# Patient Record
Sex: Female | Born: 1965 | Race: White | Hispanic: No | Marital: Married | Smoking: Never smoker
Health system: Southern US, Community
[De-identification: ages and names within clinical notes are randomized; demographics above are authoritative.]

---

## 2004-11-26 ENCOUNTER — Encounter: Payer: Self-pay | Admitting: Family Medicine

## 2004-11-26 LAB — CONVERTED CEMR LAB

## 2005-12-22 ENCOUNTER — Ambulatory Visit: Payer: Self-pay | Admitting: Family Medicine

## 2007-01-29 ENCOUNTER — Encounter: Payer: Self-pay | Admitting: Family Medicine

## 2007-08-02 ENCOUNTER — Encounter: Payer: Self-pay | Admitting: Family Medicine

## 2007-08-02 DIAGNOSIS — J301 Allergic rhinitis due to pollen: Secondary | ICD-10-CM

## 2007-08-02 DIAGNOSIS — E7800 Pure hypercholesterolemia, unspecified: Secondary | ICD-10-CM

## 2007-08-02 DIAGNOSIS — K219 Gastro-esophageal reflux disease without esophagitis: Secondary | ICD-10-CM

## 2007-08-02 DIAGNOSIS — J45909 Unspecified asthma, uncomplicated: Secondary | ICD-10-CM | POA: Insufficient documentation

## 2007-08-05 ENCOUNTER — Ambulatory Visit: Payer: Self-pay | Admitting: Family Medicine

## 2007-08-05 DIAGNOSIS — M899 Disorder of bone, unspecified: Secondary | ICD-10-CM | POA: Insufficient documentation

## 2007-08-05 DIAGNOSIS — M949 Disorder of cartilage, unspecified: Secondary | ICD-10-CM

## 2007-08-05 DIAGNOSIS — K2900 Acute gastritis without bleeding: Secondary | ICD-10-CM | POA: Insufficient documentation

## 2007-08-15 ENCOUNTER — Telehealth (INDEPENDENT_AMBULATORY_CARE_PROVIDER_SITE_OTHER): Payer: Self-pay | Admitting: *Deleted

## 2007-08-21 ENCOUNTER — Ambulatory Visit: Payer: Self-pay | Admitting: Family Medicine

## 2007-08-21 DIAGNOSIS — K644 Residual hemorrhoidal skin tags: Secondary | ICD-10-CM

## 2007-08-21 DIAGNOSIS — D179 Benign lipomatous neoplasm, unspecified: Secondary | ICD-10-CM | POA: Insufficient documentation

## 2007-08-21 DIAGNOSIS — M25519 Pain in unspecified shoulder: Secondary | ICD-10-CM

## 2007-11-28 ENCOUNTER — Ambulatory Visit (HOSPITAL_COMMUNITY): Admission: RE | Admit: 2007-11-28 | Discharge: 2007-11-28 | Payer: Self-pay | Admitting: Obstetrics and Gynecology

## 2007-11-28 ENCOUNTER — Encounter (INDEPENDENT_AMBULATORY_CARE_PROVIDER_SITE_OTHER): Payer: Self-pay | Admitting: Obstetrics and Gynecology

## 2008-04-07 ENCOUNTER — Ambulatory Visit: Payer: Self-pay | Admitting: Orthopedic Surgery

## 2008-12-01 ENCOUNTER — Ambulatory Visit: Payer: Self-pay | Admitting: Family Medicine

## 2010-09-18 ENCOUNTER — Encounter: Payer: Self-pay | Admitting: Obstetrics and Gynecology

## 2011-01-10 NOTE — Op Note (Signed)
[name], [name]       ACCOUNT NO.:  [id number]   MEDICAL RECORD NO.:  [id number]          PATIENT TYPE:  AMB   LOCATION:  SDC                           FACILITY:  [facility]   PHYSICIAN:  Melanie Kid. Rana Sheppard, M.D.    DATE OF BIRTH:  [birth year]   DATE OF PROCEDURE:  11/28/2007  DATE OF DISCHARGE:                               OPERATIVE REPORT   PREOPERATIVE DIAGNOSIS:  Abnormal uterine bleeding and endometrial mass.   POSTOPERATIVE DIAGNOSIS:  Abnormal uterine bleeding and endometrial  mass, endometrial polyp.   PROCEDURE:  Hysteroscopy dilation and curettage with polypectomy.   SURGEON:  Dr. Candice Camp.   ANESTHESIA:  Monitored anesthetic care and paracervical block.   INDICATIONS:  Melanie Sheppard is a 45 year old with abnormal uterine bleeding  whose bleeding has not been controlled with hormone therapy.  Underwent  a saline infusion ultrasound which shows a 1.8 cm density consistent  with a large endometrial polyp.  She presents for hysteroscopy, dilation  and curettage for evaluation and removal of this.  The risks and  benefits of the procedure were discussed at length.  Informed consent  was obtained.   FINDINGS AT TIME OF SURGERY:  A large endometrial polyp approximately 2  cm in size.  Otherwise normal-appearing endometrial cavity.   DESCRIPTION OF PROCEDURE:  After adequate analgesia, the patient placed  in the dorsal lithotomy position.  She is sterilely prepped and draped.  Bladder sterilely drained.  Graves speculum placed, tenaculum placed on  anterior lip of the cervix.  Paracervical block was placed with 1%  Xylocaine 1:100,000 epinephrine, total of 20 mL used.  The uterus was  dilated to a #27 Pratt dilator.  Uterus was sounded to 9 cm.  Hysteroscope was inserted.  The above findings were noted.  Polyp  forceps were used to grasp and remove the endometrial polyp in several  sections, followed by sharp curettage, retrieving small fragments of the  endometrial polyp and  also endometrial curettings.  Reexamination with  hysteroscope revealed no residual polyps and a normal-appearing  endometrial cavity, ostia and cervix.  Hysteroscope was removed.  Tenaculum removed from the cervix, noted to be hemostatic.  The patient  was then transferred to recovery room in stable condition.  Sponge and  instrument counts normal x3.  The patient received 1 gram of cefotetan  preoperatively, 30 mg Toradol postoperatively.  Sorbitol deficit was 100  mL and the blood loss was minimal.   DISPOSITION:  The patient will be discharged home with follow-up in the  office two to three weeks.  Sent home with a routine instruction sheet  for D&C. Told to return for increased pain, fever or bleeding.     Melanie Sheppard, M.D.  Electronically Signed    DCL/MEDQ  D:  11/28/2007  T:  11/28/2007  Job:  [id number]

## 2011-05-23 LAB — CBC
HCT: 38.8
RBC: 4.47

## 2011-07-02 ENCOUNTER — Emergency Department: Payer: Self-pay | Admitting: *Deleted

## 2011-07-16 ENCOUNTER — Emergency Department: Payer: Self-pay | Admitting: Emergency Medicine

## 2013-01-02 ENCOUNTER — Ambulatory Visit: Payer: Self-pay | Admitting: Internal Medicine

## 2017-05-04 ENCOUNTER — Ambulatory Visit: Payer: Self-pay | Admitting: Podiatry

## 2017-08-22 ENCOUNTER — Other Ambulatory Visit: Payer: Self-pay | Admitting: Student

## 2017-08-22 DIAGNOSIS — Z1239 Encounter for other screening for malignant neoplasm of breast: Secondary | ICD-10-CM

## 2020-03-19 ENCOUNTER — Ambulatory Visit (INDEPENDENT_AMBULATORY_CARE_PROVIDER_SITE_OTHER): Payer: 59 | Admitting: Family Medicine

## 2020-03-19 ENCOUNTER — Encounter: Payer: Self-pay | Admitting: Family Medicine

## 2020-03-19 ENCOUNTER — Other Ambulatory Visit: Payer: Self-pay

## 2020-03-19 ENCOUNTER — Ambulatory Visit (INDEPENDENT_AMBULATORY_CARE_PROVIDER_SITE_OTHER): Payer: 59

## 2020-03-19 VITALS — BP 116/84 | HR 71 | Ht 60.0 in | Wt 158.0 lb

## 2020-03-19 DIAGNOSIS — R0789 Other chest pain: Secondary | ICD-10-CM | POA: Insufficient documentation

## 2020-03-19 MED ORDER — VITAMIN D (ERGOCALCIFEROL) 1.25 MG (50000 UNIT) PO CAPS: 50000.0000 [IU] | ORAL_CAPSULE | ORAL | 0 refills | Status: AC

## 2020-03-19 MED ORDER — COLCHICINE 0.6 MG PO TABS: 0.6000 mg | ORAL_TABLET | Freq: Every day | ORAL | 0 refills | Status: DC

## 2020-03-19 NOTE — Assessment & Plan Note (Signed)
With normal x-rays likely secondary to more of a postural injury.  Patient is going to colchicine and vitamin D.  Patient is going to do some mild home exercise, trial of topical anti-inflammatories I would pretty significant that the radicular symptoms does not consistent with the injury.  We will monitor though.  Worsening pain will consider the possibility of advanced imaging but I highly think that would be unlikely.  Patient has some moderate discomfort syndrome pains especially of the upper back that could potentially respond to osteopathic manipulations which we can discuss at follow-up in 6 weeks

## 2020-03-19 NOTE — Patient Instructions (Signed)
Vitamin D once weekly Colchicine 2 times a day for 5 days Pennsaid 2 times a day  Hands within peripheral vision  See me again in 4-6 weeks

## 2020-03-19 NOTE — Progress Notes (Signed)
[facility] [address] [place] Phone: [phone] Subjective:   I [name] am serving as a Education administrator for Dr. Hulan Saas.  This visit occurred during the SARS-CoV-2 public health emergency.  Safety protocols were in place, including screening questions prior to the visit, additional usage of staff PPE, and extensive cleaning of exam room while observing appropriate contact time as indicated for disinfecting solutions.   I'm seeing this patient by the request  of:  Tower, Melanie Fanny, MD  CC: Upper musculoskeletal chest pain  [id number]  Melanie Sheppard is a 54 y.o. female coming in with complaint of chest pain. Patient states her dog jumped on her. Pain is intermitted. "popping" one of her fingers causes radiating pain up the arm into the sternum.   Onset- 3 weeks ago Location - Sternum  Duration-  Character- throbbing  Aggravating factors- lifting Reliving factors-  Therapies tried- topical (arnica) Severity-  5/10 at its worse  Denies any shortness of breath any recent illnesses  Patient did have x-rays done of the sternum and the ribs.  These were independently visualized by me showing no bony abnormality. \      Social History   Socioeconomic History  . Marital status: Married    Spouse name: Not on file  . Number of children: Not on file  . Years of education: Not on file  . Highest education level: Not on file  Occupational History  . Not on file  Tobacco Use  . Smoking status: Not on file  Substance and Sexual Activity  . Alcohol use: Not on file  . Drug use: Not on file  . Sexual activity: Not on file  Other Topics Concern  . Not on file  Social History Narrative  . Not on file   Social Determinants of Health   Financial Resource Strain:   . Difficulty of Paying Living Expenses:   Food Insecurity:   . Worried About Charity fundraiser in the Last Year:   . Arboriculturist in the Last Year:    Transportation Needs:   . Film/video editor (Medical):   Marland Kitchen Lack of Transportation (Non-Medical):   Physical Activity:   . Days of Exercise per Week:   . Minutes of Exercise per Session:   Stress:   . Feeling of Stress :   Social Connections:   . Frequency of Communication with Friends and Family:   . Frequency of Social Gatherings with Friends and Family:   . Attends Religious Services:   . Active Member of Clubs or Organizations:   . Attends Archivist Meetings:   Marland Kitchen Marital Status:    Allergies  Allergen Reactions  . Sulfamethoxazole-Trimethoprim     REACTION: blotchy rash   No family history on file.     Current Outpatient Medications (Analgesics):  .  colchicine 0.6 MG tablet, Take 1 tablet (0.6 mg total) by mouth daily.   Current Outpatient Medications (Other):  Marland Kitchen  Vitamin D, Ergocalciferol, (DRISDOL) 1.25 MG (50000 UNIT) CAPS capsule, Take 1 capsule (50,000 Units total) by mouth every 7 (seven) days.   Reviewed prior external information including notes and imaging from  primary care provider As well as notes that were available from care everywhere and other healthcare systems.  Past medical history, social, surgical and family history all reviewed in electronic medical record.  No pertanent information unless stated regarding to the chief complaint.   Review of Systems:  No headache, visual changes, nausea, vomiting, diarrhea, constipation, dizziness, abdominal pain, skin rash, fevers, chills, night sweats, weight loss, swollen lymph nodes, body aches, joint swelling, chest pain, shortness of breath, mood changes. POSITIVE muscle aches  Objective  Blood pressure 116/84, pulse 71, height 5' (1.524 m), weight 158 lb (71.7 kg), SpO2 98 %.   General: No apparent distress alert and oriented x3 mood and affect normal, dressed appropriately.  HEENT: Pupils equal, extraocular movements intact  Respiratory: Patient's speak in full sentences and does not  appear short of breath  Cardiovascular: No lower extremity edema, non tender, no erythema  Neuro: Cranial nerves II through XII are intact, neurovascularly intact in all extremities with 2+ DTRs and 2+ pulses.  Gait normal with good balance and coordination.  MSK:  Non tender with full range of motion and good stability and symmetric strength and tone of shoulders, elbows, wrist, hip, knee and ankles bilaterally.  Full range of motion of the shoulder noted.  Very mild tenderness more on the right side of the sternal border than the left.  No true defect noted but potentially some mild fullness at the insertion of the fifth costal chondral junction.   Impression and Recommendations:     The above documentation has been reviewed and is accurate and complete Lyndal Pulley, DO       Note: This dictation was prepared with Dragon dictation along with smaller phrase technology. Any transcriptional errors that result from this process are unintentional.

## 2020-04-29 ENCOUNTER — Ambulatory Visit: Payer: 59 | Admitting: Family Medicine

## 2020-06-09 ENCOUNTER — Other Ambulatory Visit: Payer: Self-pay

## 2020-06-09 ENCOUNTER — Ambulatory Visit: Payer: Managed Care, Other (non HMO) | Admitting: Family Medicine

## 2020-06-09 ENCOUNTER — Encounter: Payer: Self-pay | Admitting: Family Medicine

## 2020-06-09 VITALS — BP 112/84 | HR 82 | Ht 60.0 in

## 2020-06-09 DIAGNOSIS — M999 Biomechanical lesion, unspecified: Secondary | ICD-10-CM | POA: Insufficient documentation

## 2020-06-09 DIAGNOSIS — M2550 Pain in unspecified joint: Secondary | ICD-10-CM | POA: Diagnosis not present

## 2020-06-09 DIAGNOSIS — G2589 Other specified extrapyramidal and movement disorders: Secondary | ICD-10-CM

## 2020-06-09 LAB — COMPREHENSIVE METABOLIC PANEL
ALT: 42 U/L — ABNORMAL HIGH (ref 0–35)
AST: 23 U/L (ref 0–37)
Albumin: 4.3 g/dL (ref 3.5–5.2)
Alkaline Phosphatase: 49 U/L (ref 39–117)
BUN: 15 mg/dL (ref 6–23)
CO2: 28 mEq/L (ref 19–32)
Calcium: 9.4 mg/dL (ref 8.4–10.5)
Chloride: 104 mEq/L (ref 96–112)
Creatinine, Ser: 0.73 mg/dL (ref 0.40–1.20)
GFR: 93.13 mL/min (ref >60.00)
Glucose, Bld: 90 mg/dL (ref 70–99)
Potassium: 3.7 mEq/L (ref 3.5–5.1)
Sodium: 138 mEq/L (ref 135–145)
Total Bilirubin: 0.5 mg/dL (ref 0.2–1.2)
Total Protein: 7.6 g/dL (ref 6.0–8.3)

## 2020-06-09 LAB — TSH: TSH: 2.46 u[IU]/mL (ref 0.35–4.50)

## 2020-06-09 LAB — IBC PANEL
Iron: 70 ug/dL (ref 42–145)
Saturation Ratios: 24.9 % (ref 20.0–50.0)
Transferrin: 201 mg/dL — ABNORMAL LOW (ref 212.0–360.0)

## 2020-06-09 LAB — CBC WITH DIFFERENTIAL/PLATELET
Basophils Absolute: 0 10*3/uL (ref 0.0–0.1)
Basophils Relative: 0.7 % (ref 0.0–3.0)
Eosinophils Absolute: 0.1 10*3/uL (ref 0.0–0.7)
Eosinophils Relative: 1.8 % (ref 0.0–5.0)
HCT: 41.5 % (ref 36.0–46.0)
Hemoglobin: 14.5 g/dL (ref 12.0–15.0)
Lymphocytes Relative: 29 % (ref 12.0–46.0)
Lymphs Abs: 1.3 10*3/uL (ref 0.7–4.0)
MCHC: 35 g/dL (ref 30.0–36.0)
MCV: 88 fl (ref 78.0–100.0)
Monocytes Absolute: 0.4 10*3/uL (ref 0.1–1.0)
Monocytes Relative: 8.9 % (ref 3.0–12.0)
Neutro Abs: 2.6 10*3/uL (ref 1.4–7.7)
Neutrophils Relative %: 59.6 % (ref 43.0–77.0)
Platelets: 232 10*3/uL (ref 150.0–400.0)
RBC: 4.71 Mil/uL (ref 3.87–5.11)
RDW: 13.3 % (ref 11.5–15.5)
WBC: 4.4 10*3/uL (ref 4.0–10.5)

## 2020-06-09 LAB — SEDIMENTATION RATE: Sed Rate: 12 mm/hr (ref 0–30)

## 2020-06-09 LAB — VITAMIN D 25 HYDROXY (VIT D DEFICIENCY, FRACTURES): VITD: 43.3 ng/mL (ref 30.00–100.00)

## 2020-06-09 MED ORDER — VITAMIN D (ERGOCALCIFEROL) 1.25 MG (50000 UNIT) PO CAPS: 50000.0000 [IU] | ORAL_CAPSULE | ORAL | 0 refills | Status: AC

## 2020-06-09 NOTE — Progress Notes (Signed)
[place] Phone: [phone] Subjective:   Melanie Sheppard, am serving as a scribe for Dr. Hulan Saas. This visit occurred during the SARS-CoV-2 public health emergency.  Safety protocols were in place, including screening questions prior to the visit, additional usage of staff PPE, and extensive cleaning of exam room while observing appropriate contact time as indicated for disinfecting solutions.   I'm seeing this patient by the request  of:  Tower, Wynelle Fanny, MD  CC:   [id number]   03/19/2020 With normal x-rays likely secondary to more of a postural injury.  Patient is going to colchicine and vitamin D.  Patient is going to do some mild home exercise, trial of topical anti-inflammatories I would pretty significant that the radicular symptoms does not consistent with the injury.  We will monitor though.  Worsening pain will consider the possibility of advanced imaging but I highly think that would be unlikely.  Patient has some moderate discomfort syndrome pains especially of the upper back that could potentially respond to osteopathic manipulations which we can discuss at follow-up in 6 weeks  06/09/2020 Melanie Sheppard is a 54 y.o. female coming in with complaint of pain between scapula. Can have tinging of right arm and pain on right side down into lower back. Patient states that she does not take any medication or make modifications if she is having pain.   Patient is concerned because she told her that she was had high elevated liver enzymes.  Patient is wondering if that is causing some of the back pain.     Sheppard past medical history on file. Sheppard past surgical history on file. Social History   Socioeconomic History  . Marital status: Married    Spouse name: Not on file  . Number of children: Not on file  . Years of education: Not on file  . Highest education level: Not on file  Occupational History   . Not on file  Tobacco Use  . Smoking status: Not on file  Substance and Sexual Activity  . Alcohol use: Not on file  . Drug use: Not on file  . Sexual activity: Not on file  Other Topics Concern  . Not on file  Social History Narrative  . Not on file   Social Determinants of Health   Financial Resource Strain:   . Difficulty of Paying Living Expenses: Not on file  Food Insecurity:   . Worried About Charity fundraiser in the Last Year: Not on file  . Ran Out of Food in the Last Year: Not on file  Transportation Needs:   . Lack of Transportation (Medical): Not on file  . Lack of Transportation (Non-Medical): Not on file  Physical Activity:   . Days of Exercise per Week: Not on file  . Minutes of Exercise per Session: Not on file  Stress:   . Feeling of Stress : Not on file  Social Connections:   . Frequency of Communication with Friends and Family: Not on file  . Frequency of Social Gatherings with Friends and Family: Not on file  . Attends Religious Services: Not on file  . Active Member of Clubs or Organizations: Not on file  . Attends Archivist Meetings: Not on file  . Marital Status: Not on file   Allergies  Allergen Reactions  . Sulfamethoxazole-Trimethoprim     REACTION: blotchy rash   Sheppard family history on file.  Current Outpatient Medications (Other):  Marland Kitchen  Vitamin D, Ergocalciferol, (DRISDOL) 1.25 MG (50000 UNIT) CAPS capsule, Take 1 capsule (50,000 Units total) by mouth every 7 (seven) days. .  Vitamin D, Ergocalciferol, (DRISDOL) 1.25 MG (50000 UNIT) CAPS capsule, Take 1 capsule (50,000 Units total) by mouth every 7 (seven) days.   Reviewed prior external information including notes and imaging from  primary care provider As well as notes that were available from care everywhere and other healthcare systems.  Past medical history, social, surgical and family history all reviewed in electronic medical record.  Sheppard pertanent information  unless stated regarding to the chief complaint.   Review of Systems:  Sheppard headache, visual changes, nausea, vomiting, diarrhea, constipation, dizziness, abdominal pain, skin rash, fevers, chills, night sweats, weight loss, swollen lymph nodes, body aches, joint swelling, chest pain, shortness of breath, mood changes. POSITIVE muscle aches  Objective  Blood pressure 112/84, pulse 82, height 5' (1.524 m), SpO2 96 %.   General: Sheppard apparent distress alert and oriented x3 mood and affect normal, dressed appropriately.  HEENT: Pupils equal, extraocular movements intact  Respiratory: Patient's speak in full sentences and does not appear short of breath  Cardiovascular: Sheppard lower extremity edema, non tender, Sheppard erythema  MSK: Patient does have some pain in the paraspinal musculature of the right scapular area.  Sheppard masses appreciated.  Patient can take deep breaths without any trouble.  Patient shoulder seems to do relatively well.  Sheppard signs of any impingement.  Patient does have tightness of the hip flexor on the right side.  Negative straight leg test.  Osteopathic findings C2 flexed rotated and side bent right T4 extended rotated and side bent right inhaled third rib T6 extended rotated and side bent left L3 flexed rotated and side bent right Sacrum right on right      Impression and Recommendations:     The above documentation has been reviewed and is accurate and complete Lyndal Pulley, DO

## 2020-06-09 NOTE — Assessment & Plan Note (Signed)

## 2020-06-09 NOTE — Assessment & Plan Note (Addendum)
More of a scapular dyskinesis.  Patient is concerned that it could be potentially her liver when she has elevated liver enzymes.  Patient had hysterectomy 1 month ago she states.  I do not see these labs but I will recheck for patient.  We will see if anything else could be contributing.  We discussed the posture ergonomics, which activities to do which wants to avoid return in 6  weeks.  Responded well to manipulation  Reviewing patient's chart patient did have a kidney stone on the left side 9 years ago.  Labs ordered today.

## 2020-06-09 NOTE — Patient Instructions (Addendum)
Good to see you Scapular exercises Refilled Vitamin D Get labs  See me again in 6 weeks

## 2020-06-12 LAB — ANA: Anti Nuclear Antibody (ANA): POSITIVE — AB

## 2020-06-12 LAB — ANTI-NUCLEAR AB-TITER (ANA TITER): ANA Titer 1: 1:80 {titer} — ABNORMAL HIGH

## 2020-06-15 ENCOUNTER — Ambulatory Visit: Payer: Self-pay | Admitting: Family Medicine

## 2020-06-18 ENCOUNTER — Encounter: Payer: Self-pay | Admitting: Family Medicine

## 2020-06-18 DIAGNOSIS — R748 Abnormal levels of other serum enzymes: Secondary | ICD-10-CM

## 2020-06-18 DIAGNOSIS — M545 Low back pain, unspecified: Secondary | ICD-10-CM

## 2020-06-29 ENCOUNTER — Ambulatory Visit
Admission: RE | Admit: 2020-06-29 | Discharge: 2020-06-29 | Disposition: A | Discharge status: Home / Self Care | Payer: Managed Care, Other (non HMO) | Source: Ambulatory Visit | Attending: Family Medicine | Admitting: Family Medicine

## 2020-06-29 DIAGNOSIS — M545 Low back pain, unspecified: Secondary | ICD-10-CM

## 2020-06-29 DIAGNOSIS — R748 Abnormal levels of other serum enzymes: Secondary | ICD-10-CM

## 2020-06-30 ENCOUNTER — Encounter: Payer: Self-pay | Admitting: Family Medicine

## 2020-07-28 ENCOUNTER — Ambulatory Visit: Payer: Managed Care, Other (non HMO) | Admitting: Family Medicine

## 2020-08-23 NOTE — Progress Notes (Signed)
  [facility] [address] [place] [address] Phone: [phone] Subjective:   Bruce Donath, am serving as a scribe for Dr. Antoine Primas. This visit occurred during the SARS-CoV-2 public health emergency.  Safety protocols were in place, including screening questions prior to the visit, additional usage of staff PPE, and extensive cleaning of exam room while observing appropriate contact time as indicated for disinfecting solutions.   I'm seeing this patient by the request  of:  Tower, Audrie Gallus, MD  CC: Back and abdominal pain follow-up  [id number]  Melanie Sheppard is a 54 y.o. female coming in with complaint of back and neck pain. OMT 06/09/2020. Patient states that she has been doing well.  Patient states she has had no significant discomfort whatsoever.  Able to do daily activities fine.  Patient did have work-up including ultrasound of the abdomen that she was consistent with fatty liver and was having very mild elevation in ALT at 42 but otherwise all labs were unremarkable.  Patient has felt good otherwise.  Has changed her diet with less fatty foods and has made significant improvement.  Medications patient has been prescribed: Vit D  Taking: Taking over-the-counter         Reviewed prior external information including notes and imaging from previsou exam, outside providers and external EMR if available.   As well as notes that were available from care everywhere and other healthcare systems.  Past medical history, social, surgical and family history all reviewed in electronic medical record.  No pertanent information unless stated regarding to the chief complaint.   No past medical history on file.  Allergies  Allergen Reactions  . Sulfamethoxazole-Trimethoprim     REACTION: blotchy rash     Review of Systems:  No headache, visual changes, nausea, vomiting, diarrhea, constipation, dizziness, abdominal pain, skin rash,  fevers, chills, night sweats, weight loss, swollen lymph nodes, body aches, joint swelling, chest pain, shortness of breath, mood changes.  Objective  Blood pressure 116/84, pulse 76, height 5' (1.524 m), weight 151 lb (68.5 kg), SpO2 98 %.   General: No apparent distress alert and oriented x3 mood and affect normal, dressed appropriately.  HEENT: Pupils equal, extraocular movements intact  Respiratory: Patient's speak in full sentences and does not appear short of breath  Cardiovascular: No lower extremity edema, non tender, no erythema  Neuro: Cranial nerves II through XII are intact, neurovascularly intact in all extremities with 2+ DTRs and 2+ pulses.  Gait normal with good balance and coordination.  MSK:  Non tender with full range of motion and good stability and symmetric strength and tone of shoulders, elbows, wrist, hip, knee and ankles bilaterally.  Back - Normal skin, Spine with normal alignment and no deformity.  No tenderness to vertebral process palpation.  Paraspinous muscles are not tender and without spasm.   Range of motion is full at neck and lumbar sacral regions      Assessment and Plan:          The above documentation has been reviewed and is accurate and complete Judi Saa, DO       Note: This dictation was prepared with Dragon dictation along with smaller phrase technology. Any transcriptional errors that result from this process are unintentional.

## 2020-08-24 ENCOUNTER — Ambulatory Visit (INDEPENDENT_AMBULATORY_CARE_PROVIDER_SITE_OTHER): Payer: 59 | Admitting: Family Medicine

## 2020-08-24 ENCOUNTER — Other Ambulatory Visit: Payer: Self-pay

## 2020-08-24 ENCOUNTER — Encounter: Payer: Self-pay | Admitting: Family Medicine

## 2020-08-24 DIAGNOSIS — G2589 Other specified extrapyramidal and movement disorders: Secondary | ICD-10-CM

## 2020-08-24 NOTE — Assessment & Plan Note (Signed)
Patient no longer is having any significant type of pain at all.  He is feeling significantly better.  Patient's laboratory work showed a very mild elevation in the ALT and patient's ultrasound of the abdomen showed that patient had findings consistent with a fatty liver.  Encourage patient to follow-up with her primary care provider and have labs likely redrawn in 6 months otherwise with patient feeling as good as she is she can follow-up with me as needed.

## 2021-06-10 IMAGING — DX DG STERNUM 2+V
2 series · 2 of 2 positions shown · non-contrast
Comparison: None.

CLINICAL DATA: 54-year-old female with sternal pain for 3 weeks. No
known injury. Initial encounter.

EXAM:
STERNUM - 2+ VIEW

[sternum lat]
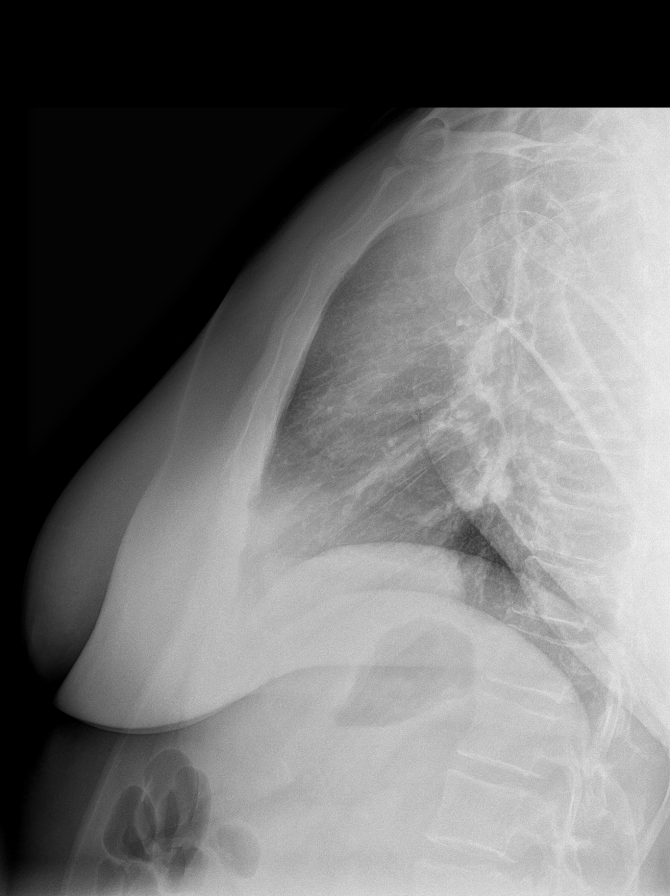

[chest pa]
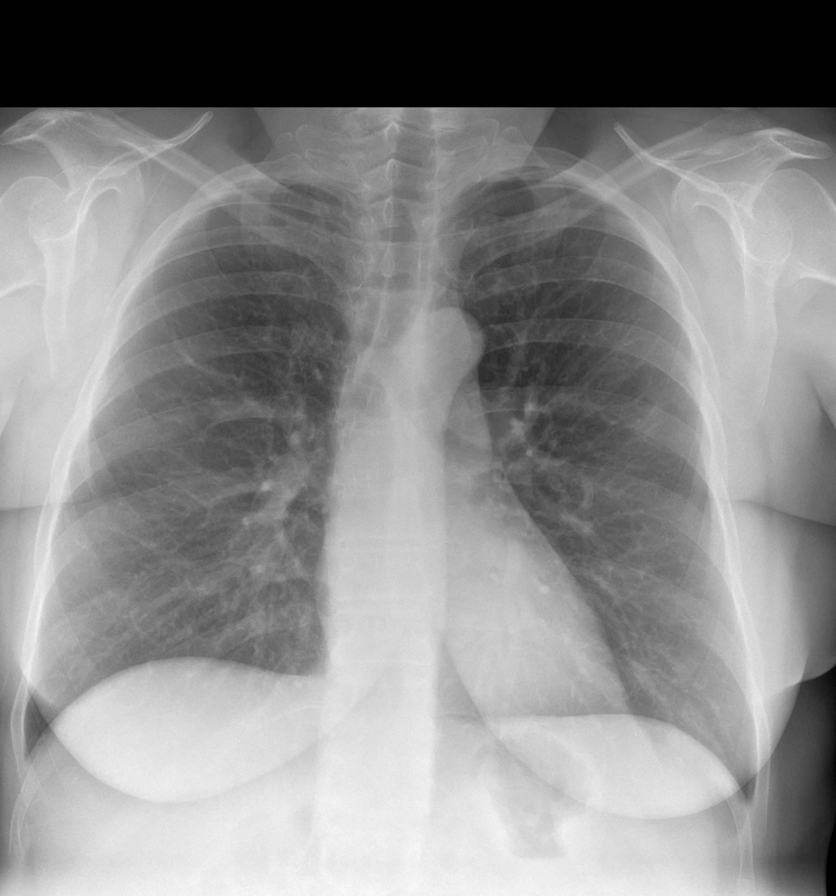

[2 of 2 positions shown; findings below may reference images not displayed]

FINDINGS: There is no evidence of fracture or other focal bone lesions.
IMPRESSION: Negative.

## 2021-06-10 IMAGING — DX DG RIBS BILAT 3V
4 series · 4 of 4 positions shown · non-contrast
Comparison: None.

CLINICAL DATA: 54-year-old female with rib discomfort.

EXAM:
BILATERAL RIBS - 3+ VIEW

[rib obl (1 of 2)]
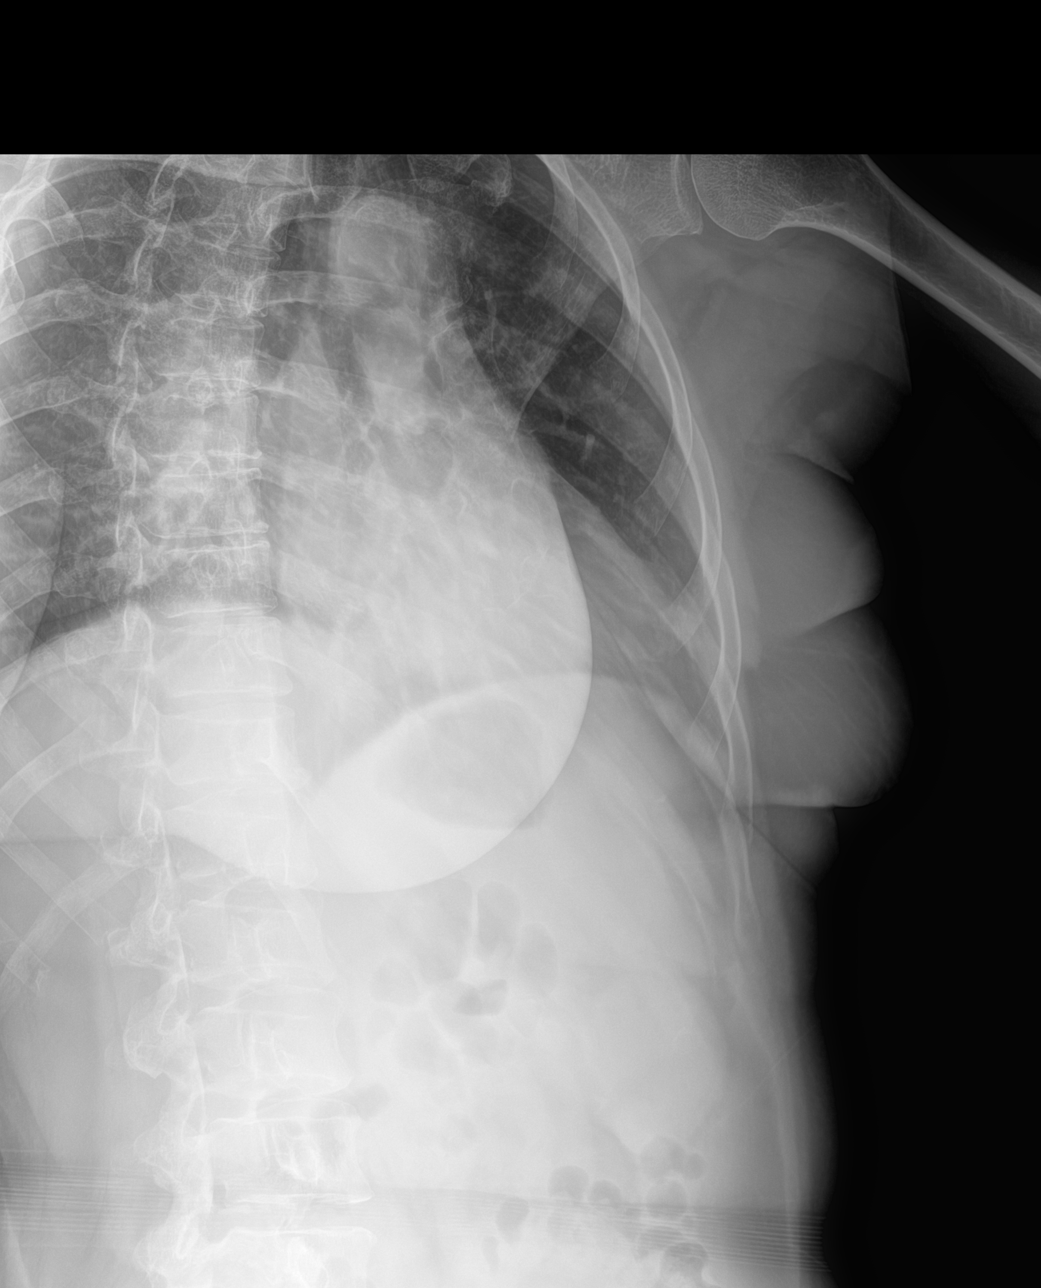

[rib obl (2 of 2)]
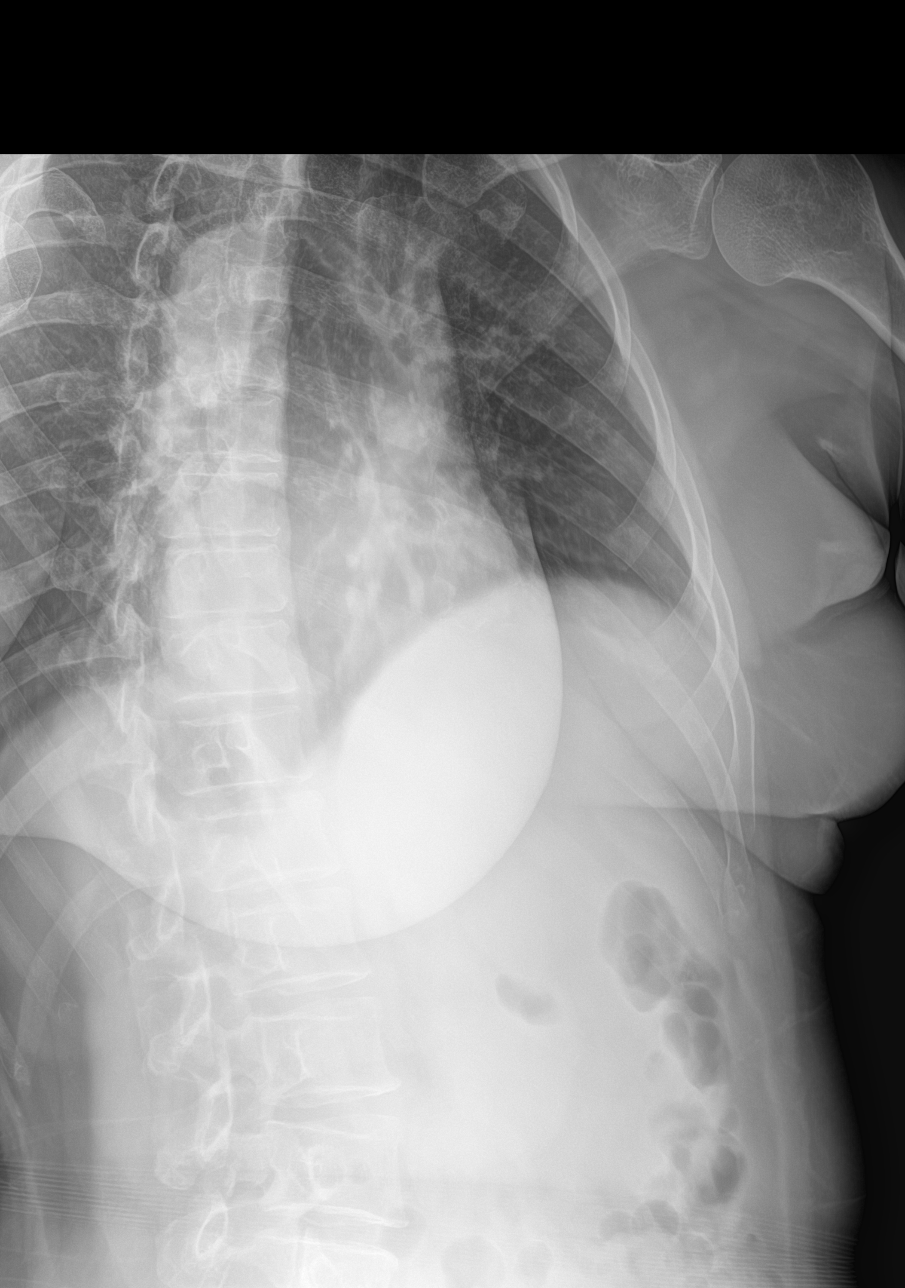

[rib pa (1 of 2)]
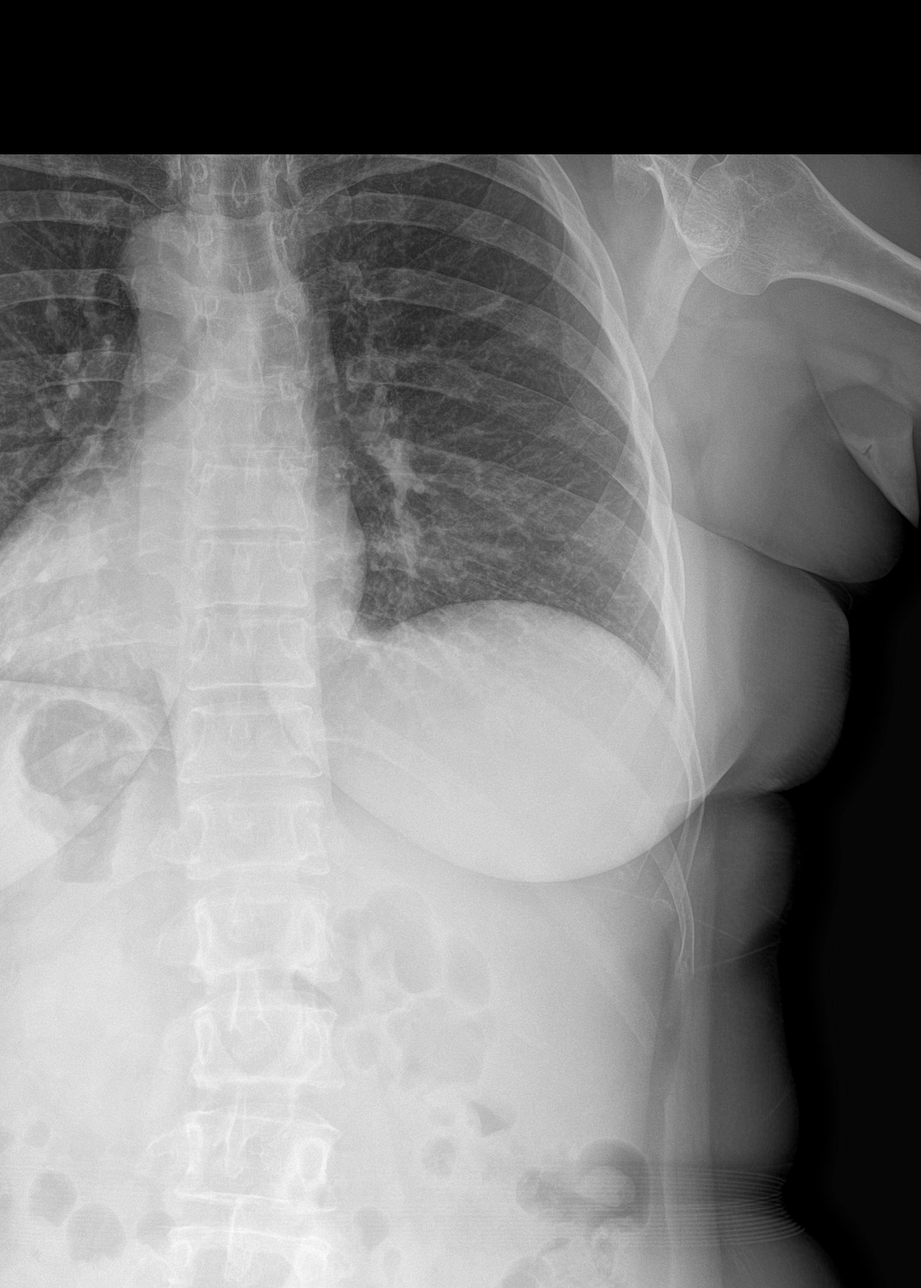

[rib pa (2 of 2)]
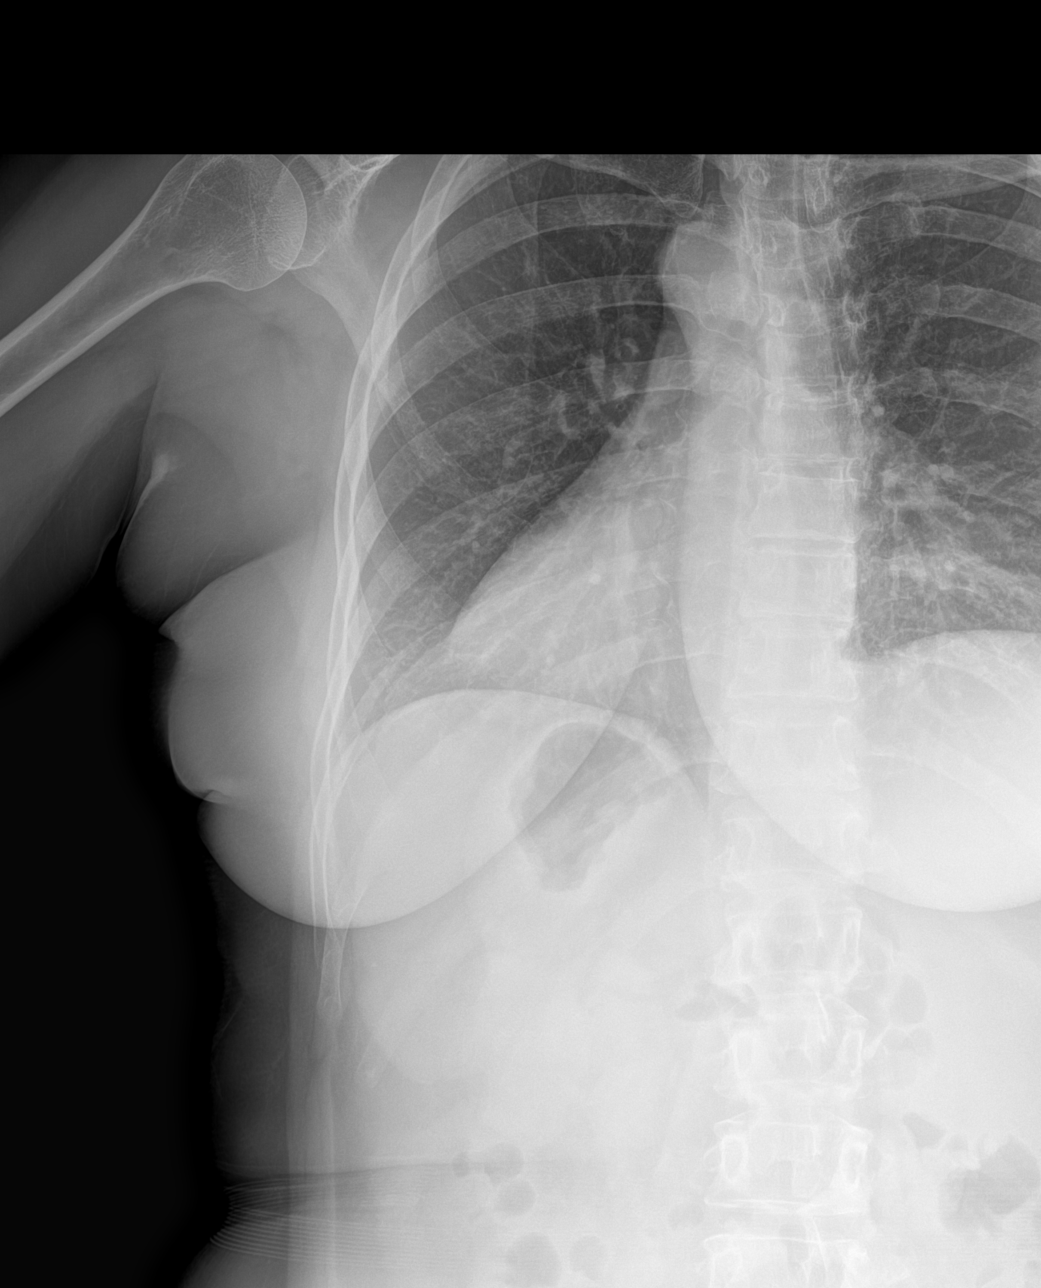

[4 of 4 positions shown; findings below may reference images not displayed]

FINDINGS: No fracture or other bone lesions are seen involving the ribs.
IMPRESSION: Negative.

## 2021-07-27 ENCOUNTER — Ambulatory Visit: Payer: 59 | Admitting: Family Medicine

## 2021-08-10 ENCOUNTER — Other Ambulatory Visit: Payer: Self-pay | Admitting: Physical Medicine and Rehabilitation

## 2021-08-10 DIAGNOSIS — Z1239 Encounter for other screening for malignant neoplasm of breast: Secondary | ICD-10-CM

## 2021-08-12 ENCOUNTER — Ambulatory Visit
Admission: RE | Admit: 2021-08-12 | Discharge: 2021-08-12 | Disposition: A | Discharge status: Home / Self Care | Payer: 59 | Source: Ambulatory Visit | Attending: Physical Medicine and Rehabilitation | Admitting: Physical Medicine and Rehabilitation

## 2021-08-12 DIAGNOSIS — Z1239 Encounter for other screening for malignant neoplasm of breast: Secondary | ICD-10-CM

## 2021-08-16 ENCOUNTER — Other Ambulatory Visit: Payer: Self-pay | Admitting: Physical Medicine and Rehabilitation

## 2021-08-16 DIAGNOSIS — R928 Other abnormal and inconclusive findings on diagnostic imaging of breast: Secondary | ICD-10-CM

## 2021-09-01 DIAGNOSIS — R922 Inconclusive mammogram: Secondary | ICD-10-CM | POA: Diagnosis not present

## 2021-09-01 DIAGNOSIS — N6459 Other signs and symptoms in breast: Secondary | ICD-10-CM | POA: Diagnosis not present

## 2021-09-01 DIAGNOSIS — N6489 Other specified disorders of breast: Secondary | ICD-10-CM | POA: Diagnosis not present

## 2021-09-14 NOTE — Progress Notes (Signed)
Office Visit Note  Patient: Melanie Sheppard             Date of Birth: 1966/06/23           MRN: [id number]             PCP: Wayland Denis, PA-C Referring: Wayland Denis, PA-C Visit Date: 09/27/2021 Occupation: @GUAROCC @  Subjective:  Positive ANA, joint pain.  History of Present Illness: Melanie Sheppard is a 56 y.o. female seen in consultation per request of her PCP for the evaluation of positive ANA.  According to the patient she has had history of dry eyes for over 10 years.  She is experience dry mouth for the last 2 years.  She also started experiencing hair loss in the last 2 years.  At the time she was seen by a physician and her labs showed positive ANA.  No further work-up was done.  She states in October 2021 she started experiencing pain in her right shoulder blade which usually got exacerbated by repeated motion.  She was seen by who did x-ray of the shoulder joint which was unremarkable.  No further work-up was done.  She states since this summer she has been experiencing increased hair loss.  She went to [place] July 2022.  She states she acquired COVID-19 infection while she went to [place] and had sore throat and fever.  She was evaluated by her PCP over there who did CT scan of her liver due to abdominal discomfort which also showed fatty liver.  Her labs were abnormal and was suggested to have repeat labs here.  She states that in the first week of December she developed left foot Planter fasciitis for which she had a cortisone injection.  The plantar fasciitis recurs off and on.  In the third week of December 2022 she developed pain and swelling in her right third MCP joint with redness over the joint.  She brought a picture on her cell phone.  Swelling lasted for 1 week and resolved by itself.  She was evaluated by her PCP who reviewed her previous labs and her symptoms and referred here for further evaluation.  There is no history of oral ulcers, nasal ulcers, malar  rash, photosensitivity, Raynaud's phenomenon or lymphadenopathy.  There is family history of rheumatoid arthritis in her mother.  She is gravida 2, para 2.  There is no history of DVTs.  Activities of Daily Living:  Patient reports morning stiffness for 0 minutes.   Patient Reports nocturnal pain.  Difficulty dressing/grooming: Denies Difficulty climbing stairs: Denies Difficulty getting out of chair: Denies Difficulty using hands for taps, buttons, cutlery, and/or writing: Denies  Review of Systems  Constitutional:  Positive for fatigue.  HENT:  Positive for mouth dryness. Negative for mouth sores and nose dryness.   Eyes:  Positive for dryness. Negative for pain and itching.  Respiratory:  Negative for shortness of breath and difficulty breathing.   Cardiovascular:  Negative for chest pain and palpitations.  Gastrointestinal:  Negative for blood in stool, constipation and diarrhea.  Endocrine: Negative for increased urination.  Genitourinary:  Negative for difficulty urinating.  Musculoskeletal:  Positive for joint pain, joint pain, joint swelling, myalgias and myalgias. Negative for morning stiffness and muscle tenderness.  Skin:  Negative for color change, rash, redness and sensitivity to sunlight.  Allergic/Immunologic: Negative for susceptible to infections.  Neurological:  Positive for dizziness and numbness. Negative for headaches and memory loss.  Hematological:  Positive for bruising/bleeding tendency. Negative for  swollen glands.  Psychiatric/Behavioral:  Negative for depressed mood, confusion and sleep disturbance. The patient is not nervous/anxious.    PMFS History:  Patient Active Problem List   Diagnosis Date Noted   Scapular dyskinesis 06/09/2020   Nonallopathic lesion of sacral region 06/09/2020   Sternal pain 03/19/2020   LIPOMA 08/21/2007   EXTERNAL HEMORRHOIDS WITH OTHER COMPLICATION 16/05/9603   SHOULDER PAIN 08/21/2007   ACUTE GASTRITIS WITHOUT MENTION OF  HEMORRHAGE 08/05/2007   OSTEOPENIA 08/05/2007   HYPERCHOLESTEROLEMIA 08/02/2007   ALLERGIC RHINITIS, SEASONAL 08/02/2007   REACTIVE AIRWAY DISEASE 08/02/2007   GERD 08/02/2007    History reviewed. No pertinent past medical history.  Family History  Problem Relation Age of Onset   Rheum arthritis Mother    Lung cancer Father    Healthy Brother    Healthy Brother    Healthy Son    Healthy Daughter    Breast cancer Neg Hx    Past Surgical History:  Procedure Laterality Date   CESAREAN SECTION     1998, 2002   Social History   Social History Narrative   Not on file   Immunization History  Administered Date(s) Administered   PFIZER Comirnaty(Gray Top)Covid-19 Tri-Sucrose Vaccine 10/31/2019, 11/27/2019, 08/30/2020   Td 01/06/2003     Objective: Vital Signs: BP 106/79 (BP Location: Right Arm, Patient Position: Sitting, Cuff Size: Normal)    Pulse 82    Ht 5' 0.5" (1.537 m)    Wt 150 lb 3.2 oz (68.1 kg)    BMI 28.85 kg/m    Physical Exam Vitals and nursing note reviewed.  Constitutional:      Appearance: She is well-developed.  HENT:     Head: Normocephalic and atraumatic.  Eyes:     Conjunctiva/sclera: Conjunctivae normal.  Cardiovascular:     Rate and Rhythm: Normal rate and regular rhythm.     Heart sounds: Normal heart sounds.  Pulmonary:     Effort: Pulmonary effort is normal.     Breath sounds: Normal breath sounds.  Abdominal:     General: Bowel sounds are normal.     Palpations: Abdomen is soft.  Musculoskeletal:     Cervical back: Normal range of motion.  Lymphadenopathy:     Cervical: No cervical adenopathy.  Skin:    General: Skin is warm and dry.     Capillary Refill: Capillary refill takes less than 2 seconds.     Comments: Generalized hair thinning was noted.  Neurological:     Mental Status: She is alert and oriented to person, place, and time.  Psychiatric:        Behavior: Behavior normal.     Musculoskeletal Exam: C-spine thoracic and  lumbar spine were in good range of motion.  She has some discomfort with range of motion of cervical spine and also some tenderness over thoracic region.  Schober's was negative.  There was no SI joint tenderness.  Shoulder joints, elbow joints, wrist joints, MCPs PIPs and DIPs with good range of motion with no synovitis.  Hip joints, knee joints, ankles, MTPs and PIPs with good range of motion.  There was no evidence of Achilles tendinitis of Planter fasciitis.  CDAI Exam: CDAI Score: -- Patient Global: --; Provider Global: -- Swollen: --; Tender: -- Joint Exam 09/27/2021   No joint exam has been documented for this visit   There is currently no information documented on the homunculus. Go to the Rheumatology activity and complete the homunculus joint exam.  Investigation: No additional findings.  Imaging: No results found.  Recent Labs: Lab Results  Component Value Date   WBC 4.4 06/09/2020   HGB 14.5 06/09/2020   PLT 232.0 06/09/2020   NA 138 06/09/2020   K 3.7 06/09/2020   CL 104 06/09/2020   CO2 28 06/09/2020   GLUCOSE 90 06/09/2020   BUN 15 06/09/2020   CREATININE 0.73 06/09/2020   BILITOT 0.5 06/09/2020   ALKPHOS 49 06/09/2020   AST 23 06/09/2020   ALT 42 (H) 06/09/2020   PROT 7.6 06/09/2020   ALBUMIN 4.3 06/09/2020   CALCIUM 9.4 06/09/2020    Speciality Comments: No specialty comments available.  Procedures:  No procedures performed Allergies: Sulfamethoxazole-trimethoprim   Assessment / Plan:     Visit Diagnoses: Positive ANA (antinuclear antibody) - 10/21-ANA 1:80NS. Pt has never seen a rheumatologist -she can history of positive ANA, neutropenia, hair loss, sicca symptoms and inflammatory arthritis.  I will obtain additional labs today.  Plan: Urinalysis, Routine w reflex microscopic, ANA, Anti-scleroderma antibody, RNP Antibody, Anti-Smith antibody, Sjogrens syndrome-A extractable nuclear antibody, Sjogrens syndrome-B extractable nuclear antibody, Anti-DNA  antibody, double-stranded, C3 and C4, Beta-2 glycoprotein antibodies, Cardiolipin antibodies, IgG, IgM, IgA, Lupus Anticoagulant Eval w/Reflex  Chronic right shoulder pain-she complains of right scapular discomfort.  She had good range of motion of her shoulder joint without any discomfort.  There was no point tenderness.  Pain in both hands -she had an episode of right third MCP swelling in December.  She brought the pictures on her cell phone.  She had no synovitis on my examination today.  Plan: XR Hand 2 View Right, XR Hand 2 View Left,.  Left CMC subluxation and narrowing was noted.  Mild PIP and DIP narrowing was noted bilaterally.  Sedimentation rate, Rheumatoid factor, Cyclic citrul peptide antibody, IgG  Plantar fasciitis-she had episode of Planter fasciitis in her left foot in December.  She states she has modified her shoes but he still has intermittent discomfort.  She had no tenderness on examination today.  Neck pain -she complains of some neck pain and stiffness.  Plan: XR Cervical Spine 2 or 3 views.  X-rays of the cervical spine showed mild facet joint arthropathy.  Pain in thoracic spine -she complains of some thoracic discomfort with repeated motion of her right arm.  Plan: XR Thoracic Spine 2 View.  Small anterior osteophytes are noted.  Other fatigue -she has been recently experiencing fatigue for the last 2 years.  The fatigue got worse after the COVID-19 infection in July 2022.  Plan: CBC with Differential/Platelet, COMPLETE METABOLIC PANEL WITH GFR, CK, Glucose 6 phosphate dehydrogenase, Serum protein electrophoresis with reflex  Fatty liver-patient states that she had CT scan of her liver in the past which was positive for fatty liver.  History of gastroesophageal reflux (GERD)  History of osteopenia  Vitamin D deficiency  Family history of rheumatoid arthritis - mother.  According to the patient her mother was treated with methotrexate and died from complications of  liver disease.  Orders: Orders Placed This Encounter  Procedures   XR Cervical Spine 2 or 3 views   XR Thoracic Spine 2 View   XR Hand 2 View Right   XR Hand 2 View Left   CBC with Differential/Platelet   COMPLETE METABOLIC PANEL WITH GFR   Urinalysis, Routine w reflex microscopic   CK   Sedimentation rate   Rheumatoid factor   Cyclic citrul peptide antibody, IgG   ANA   Anti-scleroderma antibody   RNP Antibody  Anti-Smith antibody   Sjogrens syndrome-A extractable nuclear antibody   Sjogrens syndrome-B extractable nuclear antibody   Anti-DNA antibody, double-stranded   C3 and C4   Beta-2 glycoprotein antibodies   Cardiolipin antibodies, IgG, IgM, IgA   Lupus Anticoagulant Eval w/Reflex   Glucose 6 phosphate dehydrogenase   Serum protein electrophoresis with reflex   No orders of the defined types were placed in this encounter.    Follow-Up Instructions: Return for Positive ANA, joint pain.   Bo Merino, MD  Note - This record has been created using Editor, commissioning.  Chart creation errors have been sought, but may not always  have been located. Such creation errors do not reflect on  the standard of medical care.

## 2021-09-23 ENCOUNTER — Other Ambulatory Visit: Payer: 59

## 2021-09-27 ENCOUNTER — Encounter: Payer: Self-pay | Admitting: Rheumatology

## 2021-09-27 ENCOUNTER — Ambulatory Visit: Payer: Self-pay

## 2021-09-27 ENCOUNTER — Ambulatory Visit: Payer: 59 | Admitting: Rheumatology

## 2021-09-27 ENCOUNTER — Other Ambulatory Visit: Payer: Self-pay

## 2021-09-27 VITALS — BP 106/79 | HR 82 | Ht 60.5 in | Wt 150.2 lb

## 2021-09-27 DIAGNOSIS — M546 Pain in thoracic spine: Secondary | ICD-10-CM

## 2021-09-27 DIAGNOSIS — M79641 Pain in right hand: Secondary | ICD-10-CM

## 2021-09-27 DIAGNOSIS — M722 Plantar fascial fibromatosis: Secondary | ICD-10-CM | POA: Diagnosis not present

## 2021-09-27 DIAGNOSIS — E559 Vitamin D deficiency, unspecified: Secondary | ICD-10-CM

## 2021-09-27 DIAGNOSIS — M25511 Pain in right shoulder: Secondary | ICD-10-CM | POA: Diagnosis not present

## 2021-09-27 DIAGNOSIS — M542 Cervicalgia: Secondary | ICD-10-CM | POA: Diagnosis not present

## 2021-09-27 DIAGNOSIS — E7800 Pure hypercholesterolemia, unspecified: Secondary | ICD-10-CM

## 2021-09-27 DIAGNOSIS — R5383 Other fatigue: Secondary | ICD-10-CM

## 2021-09-27 DIAGNOSIS — M79642 Pain in left hand: Secondary | ICD-10-CM

## 2021-09-27 DIAGNOSIS — K760 Fatty (change of) liver, not elsewhere classified: Secondary | ICD-10-CM

## 2021-09-27 DIAGNOSIS — Z8719 Personal history of other diseases of the digestive system: Secondary | ICD-10-CM

## 2021-09-27 DIAGNOSIS — G8929 Other chronic pain: Secondary | ICD-10-CM

## 2021-09-27 DIAGNOSIS — R768 Other specified abnormal immunological findings in serum: Secondary | ICD-10-CM

## 2021-09-27 DIAGNOSIS — Z8739 Personal history of other diseases of the musculoskeletal system and connective tissue: Secondary | ICD-10-CM

## 2021-09-27 DIAGNOSIS — Z8261 Family history of arthritis: Secondary | ICD-10-CM

## 2021-10-02 LAB — CBC WITH DIFFERENTIAL/PLATELET
Absolute Monocytes: 241 cells/uL (ref 200–950)
Basophils Absolute: 50 cells/uL (ref 0–200)
Basophils Relative: 1.4 %
Eosinophils Absolute: 101 cells/uL (ref 15–500)
Eosinophils Relative: 2.8 %
HCT: 45.2 % — ABNORMAL HIGH (ref 35.0–45.0)
Hemoglobin: 14.9 g/dL (ref 11.7–15.5)
Lymphs Abs: 1444 cells/uL (ref 850–3900)
MCH: 30.4 pg (ref 27.0–33.0)
MCHC: 33 g/dL (ref 32.0–36.0)
MCV: 92.2 fL (ref 80.0–100.0)
MPV: 10 fL (ref 7.5–12.5)
Monocytes Relative: 6.7 %
Neutro Abs: 1764 cells/uL (ref 1500–7800)
Neutrophils Relative %: 49 %
Platelets: 258 10*3/uL (ref 140–400)
RBC: 4.9 10*6/uL (ref 3.80–5.10)
RDW: 12.8 % (ref 11.0–15.0)
Total Lymphocyte: 40.1 %
WBC: 3.6 10*3/uL — ABNORMAL LOW (ref 3.8–10.8)

## 2021-10-02 LAB — ANTI-SMITH ANTIBODY: ENA SM Ab Ser-aCnc: <1 AI

## 2021-10-02 LAB — ANA: Anti Nuclear Antibody (ANA): POSITIVE — AB

## 2021-10-02 LAB — SJOGRENS SYNDROME-B EXTRACTABLE NUCLEAR ANTIBODY: SSB (La) (ENA) Antibody, IgG: <1 AI

## 2021-10-02 LAB — PROTEIN ELECTROPHORESIS, SERUM, WITH REFLEX
Albumin ELP: 4.4 g/dL (ref 3.8–4.8)
Alpha 1: 0.2 g/dL (ref 0.2–0.3)
Alpha 2: 0.7 g/dL (ref 0.5–0.9)
Beta 2: 0.4 g/dL (ref 0.2–0.5)
Beta Globulin: 0.4 g/dL (ref 0.4–0.6)
Gamma Globulin: 1.5 g/dL (ref 0.8–1.7)
Total Protein: 7.6 g/dL (ref 6.1–8.1)

## 2021-10-02 LAB — C3 AND C4
C3 Complement: 139 mg/dL (ref 83–193)
C4 Complement: 25 mg/dL (ref 15–57)

## 2021-10-02 LAB — URINALYSIS, ROUTINE W REFLEX MICROSCOPIC
Bilirubin Urine: NEGATIVE
Glucose, UA: NEGATIVE
Hgb urine dipstick: NEGATIVE
Ketones, ur: NEGATIVE
Leukocytes,Ua: NEGATIVE
Nitrite: NEGATIVE
Protein, ur: NEGATIVE
Specific Gravity, Urine: 1.023 (ref 1.001–1.035)
pH: <=5 (ref 5.0–8.0)

## 2021-10-02 LAB — LUPUS ANTICOAGULANT EVAL W/ REFLEX
PTT-LA Screen: 36 s (ref <=40)
dRVVT: 38 s (ref <=45)

## 2021-10-02 LAB — GLUCOSE 6 PHOSPHATE DEHYDROGENASE: G-6PDH: 12.7 U/g Hgb (ref 7.0–20.5)

## 2021-10-02 LAB — ANTI-SCLERODERMA ANTIBODY: Scleroderma (Scl-70) (ENA) Antibody, IgG: <1 AI

## 2021-10-02 LAB — COMPLETE METABOLIC PANEL WITH GFR
AG Ratio: 1.5 (calc) (ref 1.0–2.5)
ALT: 24 U/L (ref 6–29)
AST: 20 U/L (ref 10–35)
Albumin: 4.6 g/dL (ref 3.6–5.1)
Alkaline phosphatase (APISO): 60 U/L (ref 37–153)
BUN: 15 mg/dL (ref 7–25)
CO2: 30 mmol/L (ref 20–32)
Calcium: 10.1 mg/dL (ref 8.6–10.4)
Chloride: 106 mmol/L (ref 98–110)
Creat: 0.76 mg/dL (ref 0.50–1.03)
Globulin: 3.1 g/dL (calc) (ref 1.9–3.7)
Glucose, Bld: 89 mg/dL (ref 65–99)
Potassium: 4.3 mmol/L (ref 3.5–5.3)
Sodium: 141 mmol/L (ref 135–146)
Total Bilirubin: 0.5 mg/dL (ref 0.2–1.2)
Total Protein: 7.7 g/dL (ref 6.1–8.1)
eGFR: 92 mL/min/{1.73_m2} (ref >=60)

## 2021-10-02 LAB — BETA-2 GLYCOPROTEIN ANTIBODIES
Beta-2 Glyco 1 IgA: 32 U/mL — ABNORMAL HIGH
Beta-2 Glyco 1 IgM: 14.1 U/mL
Beta-2 Glyco I IgG: <2 U/mL

## 2021-10-02 LAB — CYCLIC CITRUL PEPTIDE ANTIBODY, IGG: Cyclic Citrullin Peptide Ab: <16 UNITS

## 2021-10-02 LAB — CARDIOLIPIN ANTIBODIES, IGG, IGM, IGA
Anticardiolipin IgA: 34.6 APL-U/mL — ABNORMAL HIGH
Anticardiolipin IgG: <2 GPL-U/mL
Anticardiolipin IgM: 11.6 MPL-U/mL

## 2021-10-02 LAB — ANTI-NUCLEAR AB-TITER (ANA TITER): ANA Titer 1: 1:40 {titer} — ABNORMAL HIGH

## 2021-10-02 LAB — RHEUMATOID FACTOR: Rheumatoid fact SerPl-aCnc: <14 IU/mL (ref <14)

## 2021-10-02 LAB — CK: Total CK: 82 U/L (ref 29–143)

## 2021-10-02 LAB — SEDIMENTATION RATE: Sed Rate: 2 mm/h (ref 0–30)

## 2021-10-02 LAB — ANTI-DNA ANTIBODY, DOUBLE-STRANDED: ds DNA Ab: 2 IU/mL

## 2021-10-02 LAB — SJOGRENS SYNDROME-A EXTRACTABLE NUCLEAR ANTIBODY: SSA (Ro) (ENA) Antibody, IgG: >8 AI — AB

## 2021-10-02 LAB — RNP ANTIBODY: Ribonucleic Protein(ENA) Antibody, IgG: <1 AI

## 2021-10-03 NOTE — Progress Notes (Signed)
I will discuss results at the follow-up visit.

## 2021-10-18 ENCOUNTER — Ambulatory Visit: Payer: 59 | Admitting: Rheumatology

## 2021-10-24 NOTE — Progress Notes (Signed)
Office Visit Note  Patient: Melanie Sheppard             Date of Birth: 03/19/1966           MRN: [id number]             PCP: Wayland Denis, PA-C Referring: Wayland Denis, PA-C Visit Date: 11/02/2021 Occupation: @GUAROCC @  Subjective:  Pain in hands and feet  History of Present Illness: Melanie Sheppard is a 56 y.o. female with history of inflammatory arthritis and sicca symptoms.  She returns for follow-up visit.  She states she continues to have pain and discomfort in her bilateral hands and her feet intermittently.  Her feet has been more bothersome recently.  She states that she has a stiffness after prolonged walking and also at night.  She has not noticed any joint swelling recently.  She continues to have some discomfort in her right shoulder.  She complains of dry mouth and dry eyes.  She has not had any recent episodes of Planter fasciitis.  She denies any history of oral ulcers, nasal ulcers, malar rash, photosensitivity, Raynaud's phenomenon or lymphadenopathy.  Activities of Daily Living:  Patient reports morning stiffness for 0 minutes.   Patient Denies nocturnal pain.  Difficulty dressing/grooming: Denies Difficulty climbing stairs: Denies Difficulty getting out of chair: Denies Difficulty using hands for taps, buttons, cutlery, and/or writing: Denies  Review of Systems  Constitutional:  Negative for fatigue.  HENT:  Positive for mouth dryness and nose dryness. Negative for mouth sores.   Eyes:  Positive for dryness. Negative for pain and itching.  Respiratory:  Negative for shortness of breath and difficulty breathing.   Cardiovascular:  Negative for chest pain and palpitations.  Gastrointestinal:  Negative for blood in stool, constipation and diarrhea.  Endocrine: Negative for increased urination.  Genitourinary:  Negative for difficulty urinating.  Musculoskeletal:  Positive for joint pain and joint pain. Negative for joint swelling, myalgias, morning  stiffness, muscle tenderness and myalgias.  Skin:  Negative for color change, rash and redness.  Allergic/Immunologic: Negative for susceptible to infections.  Neurological:  Negative for dizziness, numbness, headaches, memory loss and weakness.  Hematological:  Positive for bruising/bleeding tendency.  Psychiatric/Behavioral:  Negative for confusion.    PMFS History:  Patient Active Problem List   Diagnosis Date Noted   Scapular dyskinesis 06/09/2020   Nonallopathic lesion of sacral region 06/09/2020   Sternal pain 03/19/2020   LIPOMA 08/21/2007   EXTERNAL HEMORRHOIDS WITH OTHER COMPLICATION 38/05/1750   SHOULDER PAIN 08/21/2007   ACUTE GASTRITIS WITHOUT MENTION OF HEMORRHAGE 08/05/2007   OSTEOPENIA 08/05/2007   HYPERCHOLESTEROLEMIA 08/02/2007   ALLERGIC RHINITIS, SEASONAL 08/02/2007   REACTIVE AIRWAY DISEASE 08/02/2007   GERD 08/02/2007    History reviewed. No pertinent past medical history.  Family History  Problem Relation Age of Onset   Rheum arthritis Mother    Lung cancer Father    Healthy Brother    Healthy Brother    Healthy Son    Healthy Daughter    Breast cancer Neg Hx    Past Surgical History:  Procedure Laterality Date   CESAREAN SECTION     1998, 2002   Social History   Social History Narrative   Not on file   Immunization History  Administered Date(s) Administered   PFIZER Comirnaty(Gray Top)Covid-19 Tri-Sucrose Vaccine 10/31/2019, 11/27/2019, 08/30/2020   Td 01/06/2003     Objective: Vital Signs: BP 105/71 (BP Location: Right Arm, Patient Position: Sitting, Cuff Size: Normal)    Pulse  74    Ht 5' (1.524 m)    Wt 154 lb 3.2 oz (69.9 kg)    BMI 30.12 kg/m    Physical Exam Vitals and nursing note reviewed.  Constitutional:      Appearance: She is well-developed.  HENT:     Head: Normocephalic and atraumatic.  Eyes:     Conjunctiva/sclera: Conjunctivae normal.  Cardiovascular:     Rate and Rhythm: Normal rate and regular rhythm.     Heart  sounds: Normal heart sounds.  Pulmonary:     Effort: Pulmonary effort is normal.     Breath sounds: Normal breath sounds.  Abdominal:     General: Bowel sounds are normal.     Palpations: Abdomen is soft.  Musculoskeletal:     Cervical back: Normal range of motion.  Lymphadenopathy:     Cervical: No cervical adenopathy.  Skin:    General: Skin is warm and dry.     Capillary Refill: Capillary refill takes less than 2 seconds.     Comments: Hair thinning was noted.  Neurological:     Mental Status: She is alert and oriented to person, place, and time.  Psychiatric:        Behavior: Behavior normal.     Musculoskeletal Exam: C-spine thoracic and lumbar spine with good range of motion.  She had good range of motion of bilateral shoulders, elbows, wrist joint, MCPs PIPs and DIPs.  No synovitis was noted.  Hip joints, knee joints, ankles, MTPs and PIPs with good range of motion with no synovitis.  She had no tenderness over MTPs or ankles.  CDAI Exam: CDAI Score: -- Patient Global: --; Provider Global: -- Swollen: --; Tender: -- Joint Exam 11/02/2021   No joint exam has been documented for this visit   There is currently no information documented on the homunculus. Go to the Rheumatology activity and complete the homunculus joint exam.  Investigation: No additional findings.  Imaging: No results found.  Recent Labs: Lab Results  Component Value Date   WBC 3.6 (L) 09/27/2021   HGB 14.9 09/27/2021   PLT 258 09/27/2021   NA 141 09/27/2021   K 4.3 09/27/2021   CL 106 09/27/2021   CO2 30 09/27/2021   GLUCOSE 89 09/27/2021   BUN 15 09/27/2021   CREATININE 0.76 09/27/2021   BILITOT 0.5 09/27/2021   ALKPHOS 49 06/09/2020   AST 20 09/27/2021   ALT 24 09/27/2021   PROT 7.7 09/27/2021   PROT 7.6 09/27/2021   ALBUMIN 4.3 06/09/2020   CALCIUM 10.1 09/27/2021   September 27, 2021 UA negative, SPEP normal, CK 82, ESR 2, RF negative, anti-CCP negative, ANA 1: 40NS, SSA> 8.0,  (SSB, Smith, RNP, SCL 70, dsDNA negative), anticardiolipin IgA positive, beta-2 IgA positive, lupus anticoagulant negative, C3-C4 normal, G6PD normal   Speciality Comments: No specialty comments available.  Procedures:  No procedures performed Allergies: Sulfamethoxazole-trimethoprim   Assessment / Plan:     Visit Diagnoses: Sjogren's syndrome with other organ involvement ([place]) - ANA 1: 40NS, SSA antibody> 8.0, neutropenia, hair loss, sicca symptoms and history of inflammatory arthritis.  She continues to have dry mouth and dry eyes.  She has been having pain and stiffness in her hands and her feet.  No synovitis was noted on the examination today.  Detailed counsel regarding Sjogren's syndrome was provided.  Different treatment options and their side effects were discussed.  Over-the-counter products for dry eyes and dry mouth were discussed.  I discussed the option of  starting on hydroxychloroquine with her episode of inflammatory arthritis.  Patient was in agreement.  Handout was given and consent was taken.  I will start her on hydroxychloroquine 200 mg p.o. twice daily Monday to Friday.  We will recheck labs in a month and then every 3 months to monitor for drug toxicity.  If labs are stable then we can check every 5 months.  Baseline eye examination and then yearly eye examination was also advised.  She was advised to get a baseline EKG with her PCP as anti-Ro antibody could be associated with arrhythmias.  Handout on Sjogren's syndrome was also provided.  Anticardiolipin and beta-2 GP 1 IgM positive  Patient was counseled on the purpose, proper use, and adverse effects of hydroxychloroquine including nausea/diarrhea, skin rash, headaches, and sun sensitivity.  Advised patient to wear sunscreen once starting hydroxychloroquine to reduce risk of rash associated with sun sensitivity.  Discussed importance of annual eye exams while on hydroxychloroquine to monitor to ocular toxicity and discussed  importance of frequent laboratory monitoring.  Provided patient with eye exam form for baseline ophthalmologic exam.  Reviewed risk for QTC prolongation when used in combination with other QTc prolonging agents (including but not limited to antiarrhythmics, macrolide antibiotics, flouroquinolones, tricyclic antidepressants, citalopram, specific antipsychotics, ondansetron, migraine triptans, and methadone). Provided patient with educational materials on hydroxychloroquine and answered all questions.  Patient consented to hydroxychloroquine. Will upload consent in the media tab.    Dose will be Plaquenil 200 mg twice daily Monday through Friday.  High risk medication use -recommendations regarding realization was also placed in the AVS.  Anticardiolipin antibody positive she had positive low titer anticardiolipin IgA and beta-2 GP 1 IgG.  I will repeat the test results in 3 months.  Chronic right shoulder pain -she has history of right scapular discomfort.  She had good range of motion of her right shoulder joint without any effusion.  Pain in both hands - Right third MCP swelling was noted in the pictures from December 2022.  She had no synovitis on examination today.  Plantar fasciitis - Planter fasciitis left foot in December 22.  She continues to have discomfort in her bilateral feet.  We will Ro see response to hydroxychloroquine use.  Neck pain - X-ray showed mild facet joint arthropathy.  She continues to have some stiffness in her neck.  Exercises were discussed.  Pain in thoracic spine - Small anterior osteophytes were noted.  Other fatigue - History of increased fatigue since COVID-19 infection in July 2022  Fatty liver - She had CT scan in the past which showed fatty liver.  Repeat LFTs are normal.  History of gastroesophageal reflux (GERD)  History of osteopenia  Vitamin D deficiency  Family history of rheumatoid arthritis - Her mother was treated with methotrexate and died from  complications of liver disease per patient.  Orders: Orders Placed This Encounter  Procedures   Beta-2 glycoprotein antibodies   Cardiolipin antibodies, IgG, IgM, IgA   No orders of the defined types were placed in this encounter.    Follow-Up Instructions: Return in about 2 months (around 01/02/2022) for Sjogren's.   Bo Merino, MD  Note - This record has been created using Editor, commissioning.  Chart creation errors have been sought, but may not always  have been located. Such creation errors do not reflect on  the standard of medical care.

## 2021-11-02 ENCOUNTER — Encounter: Payer: Self-pay | Admitting: Rheumatology

## 2021-11-02 ENCOUNTER — Ambulatory Visit: Payer: 59 | Admitting: Rheumatology

## 2021-11-02 ENCOUNTER — Other Ambulatory Visit: Payer: Self-pay

## 2021-11-02 VITALS — BP 105/71 | HR 74 | Ht 60.0 in | Wt 154.2 lb

## 2021-11-02 DIAGNOSIS — Z8719 Personal history of other diseases of the digestive system: Secondary | ICD-10-CM | POA: Diagnosis not present

## 2021-11-02 DIAGNOSIS — M25511 Pain in right shoulder: Secondary | ICD-10-CM | POA: Diagnosis not present

## 2021-11-02 DIAGNOSIS — Z79899 Other long term (current) drug therapy: Secondary | ICD-10-CM

## 2021-11-02 DIAGNOSIS — K760 Fatty (change of) liver, not elsewhere classified: Secondary | ICD-10-CM

## 2021-11-02 DIAGNOSIS — Z8739 Personal history of other diseases of the musculoskeletal system and connective tissue: Secondary | ICD-10-CM

## 2021-11-02 DIAGNOSIS — M3509 Sicca syndrome with other organ involvement: Secondary | ICD-10-CM

## 2021-11-02 DIAGNOSIS — G8929 Other chronic pain: Secondary | ICD-10-CM

## 2021-11-02 DIAGNOSIS — M542 Cervicalgia: Secondary | ICD-10-CM

## 2021-11-02 DIAGNOSIS — M546 Pain in thoracic spine: Secondary | ICD-10-CM

## 2021-11-02 DIAGNOSIS — M79641 Pain in right hand: Secondary | ICD-10-CM

## 2021-11-02 DIAGNOSIS — M79642 Pain in left hand: Secondary | ICD-10-CM

## 2021-11-02 DIAGNOSIS — M722 Plantar fascial fibromatosis: Secondary | ICD-10-CM | POA: Diagnosis not present

## 2021-11-02 DIAGNOSIS — R760 Raised antibody titer: Secondary | ICD-10-CM | POA: Diagnosis not present

## 2021-11-02 DIAGNOSIS — R5383 Other fatigue: Secondary | ICD-10-CM

## 2021-11-02 DIAGNOSIS — Z8261 Family history of arthritis: Secondary | ICD-10-CM

## 2021-11-02 DIAGNOSIS — E559 Vitamin D deficiency, unspecified: Secondary | ICD-10-CM

## 2021-11-02 MED ORDER — HYDROXYCHLOROQUINE SULFATE 200 MG PO TABS: ORAL_TABLET | ORAL | 2 refills | Status: AC

## 2021-11-02 NOTE — Progress Notes (Signed)
Pharmacy Note ? ?Subjective: ?Patient presents today to [facility] Rhe[facility]ology for follow up office visit.   Patient seen by the pharmacist for counseling on hydroxychloroquine for Sjogren's syndrome.  ? ?Objective: ?CMP  ?   ?Component Value Date/Time  ? NA 141 09/27/2021 0936  ? K 4.3 09/27/2021 0936  ? CL 106 09/27/2021 0936  ? CO2 30 09/27/2021 0936  ? GLUCOSE 89 09/27/2021 0936  ? BUN 15 09/27/2021 0936  ? CREATININE 0.76 09/27/2021 0936  ? CALCIUM 10.1 09/27/2021 0936  ? PROT 7.7 09/27/2021 0936  ? PROT 7.6 09/27/2021 0936  ? ALBUMIN 4.3 06/09/2020 1340  ? AST 20 09/27/2021 0936  ? ALT 24 09/27/2021 0936  ? ALKPHOS 49 06/09/2020 1340  ? BILITOT 0.5 09/27/2021 0936  ? ? ?CBC ?   ?Component Value Date/Time  ? WBC 3.6 (L) 09/27/2021 0936  ? RBC 4.90 09/27/2021 0936  ? HGB 14.9 09/27/2021 0936  ? HCT 45.2 (H) 09/27/2021 0936  ? PLT 258 09/27/2021 0936  ? MCV 92.2 09/27/2021 0936  ? MCH 30.4 09/27/2021 0936  ? MCHC 33.0 09/27/2021 0936  ? RDW 12.8 09/27/2021 0936  ? LYMPHSABS 1,444 09/27/2021 0936  ? MONOABS 0.4 06/09/2020 1340  ? EOSABS 101 09/27/2021 0936  ? BASOSABS 50 09/27/2021 0936  ? ? ?Assessment/Plan: ?Patient was counseled on the purpose, proper use, and adverse effects of hydroxychloroquine including nausea/diarrhea, skin rash, headaches, and sun sensitivity.  Advised patient to wear sunscreen once starting hydroxychloroquine to reduce risk of rash associated with sun sensitivity.  Discussed importance of annual eye exams while on hydroxychloroquine to monitor to ocular toxicity and discussed importance of frequent laboratory monitoring.  Provided patient with eye exam form for baseline ophthalmologic exam.  Reviewed risk for QTC prolongation when used in combination with other QTc prolonging agents (including but not limited to antiarrhythmics, macrolide antibiotics, flouroquinolones, tricyclic antidepressants, citalopram, specific antipsychotics, ondansetron, migraine triptans, and methadone). No  drug interactions noted.  Provided patient with educational materials on hydroxychloroquine and answered all questions.  Patient consented to hydroxychloroquine. Will upload consent in the media tab.   ? ?Dose will be Plaquenil 200 mg twice daily Monday through Friday. Rx sent to pharmacy today ? ?Knox Saliva, PharmD, MPH, BCPS ?Clinical Pharmacist (Rheumatology and Pulmonology) ?

## 2021-11-02 NOTE — Patient Instructions (Addendum)
Hydroxychloroquine Tablets What is this medication? HYDROXYCHLOROQUINE (hye drox ee KLOR oh kwin) treats autoimmune conditions, such as rheumatoid arthritis and lupus. It works by slowing down an overactive immune system. It may also be used to prevent and treat malaria. It works by killing the parasite that causes malaria. It belongs to a group of medications called DMARDs. This medicine may be used for other purposes; ask your health care provider or pharmacist if you have questions. COMMON BRAND NAME(S): Plaquenil, Quineprox What should I tell my care team before I take this medication? They need to know if you have any of these conditions: Diabetes Eye disease, vision problems G6PD deficiency Heart disease History of irregular heartbeat If you often drink alcohol Kidney disease Liver disease Porphyria Psoriasis An unusual or allergic reaction to chloroquine, hydroxychloroquine, other medications, foods, dyes, or preservatives Pregnant or trying to get pregnant Breast-feeding How should I use this medication? Take this medication by mouth with a glass of water. Take it as directed on the prescription label. Do not cut, crush or chew this medication. Swallow the tablets whole. Take it with food. Do not take it more than directed. Take all of this medication unless your care team tells you to stop it early. Keep taking it even if you think you are better. Take products with antacids in them at a different time of day than this medication. Take this medication 4 hours before or 4 hours after antacids. Talk to your care team if you have questions. Talk to your care team about the use of this medication in children. While this medication may be prescribed for selected conditions, precautions do apply. Overdosage: If you think you have taken too much of this medicine contact a poison control center or emergency room at once. NOTE: This medicine is only for you. Do not share this medicine with  others. What if I miss a dose? If you miss a dose, take it as soon as you can. If it is almost time for your next dose, take only that dose. Do not take double or extra doses. What may interact with this medication? Do not take this medication with any of the following: Cisapride Dronedarone Pimozide Thioridazine This medication may also interact with the following: Ampicillin Antacids Cimetidine Cyclosporine Digoxin Kaolin Medications for diabetes, like insulin, glipizide, glyburide Medications for seizures like carbamazepine, phenobarbital, phenytoin Mefloquine Methotrexate Other medications that prolong the QT interval (cause an abnormal heart rhythm) Praziquantel This list may not describe all possible interactions. Give your health care provider a list of all the medicines, herbs, non-prescription drugs, or dietary supplements you use. Also tell them if you smoke, drink alcohol, or use illegal drugs. Some items may interact with your medicine. What should I watch for while using this medication? Visit your care team for regular checks on your progress. Tell your care team if your symptoms do not start to get better or if they get worse. You may need blood work done while you are taking this medication. If you take other medications that can affect heart rhythm, you may need more testing. Talk to your care team if you have questions. Your vision may be tested before and during use of this medication. Tell your care team right away if you have any change in your eyesight. This medication may cause serious skin reactions. They can happen weeks to months after starting the medication. Contact your care team right away if you notice fevers or flu-like symptoms with a rash. The  rash may be red or purple and then turn into blisters or peeling of the skin. Or, you might notice a red rash with swelling of the face, lips or lymph nodes in your neck or under your arms. If you or your family  notice any changes in your behavior, such as new or worsening depression, thoughts of harming yourself, anxiety, or other unusual or disturbing thoughts, or memory loss, call your care team right away. What side effects may I notice from receiving this medication? Side effects that you should report to your care team as soon as possible: Allergic reactions--skin rash, itching, hives, swelling of the face, lips, tongue, or throat Aplastic anemia--unusual weakness or fatigue, dizziness, headache, trouble breathing, increased bleeding or bruising Change in vision Heart rhythm changes--fast or irregular heartbeat, dizziness, feeling faint or lightheaded, chest pain, trouble breathing Infection--fever, chills, cough, or sore throat Low blood sugar (hypoglycemia)--tremors or shaking, anxiety, sweating, cold or clammy skin, confusion, dizziness, rapid heartbeat Muscle injury--unusual weakness or fatigue, muscle pain, dark yellow or brown urine, decrease in amount of urine Pain, tingling, or numbness in the hands or feet Rash, fever, and swollen lymph nodes Redness, blistering, peeling, or loosening of the skin, including inside the mouth Thoughts of suicide or self-harm, worsening mood, or feelings of depression Unusual bruising or bleeding Side effects that usually do not require medical attention (report to your care team if they continue or are bothersome): Diarrhea Headache Nausea Stomach pain Vomiting This list may not describe all possible side effects. Call your doctor for medical advice about side effects. You may report side effects to FDA at 1-800-FDA-1088. Where should I keep my medication? Keep out of the reach of children and pets. Store at room temperature up to 30 degrees C (86 degrees F). Protect from light. Get rid of any unused medication after the expiration date. To get rid of medications that are no longer needed or have expired: Take the medication to a medication take-back  program. Check with your pharmacy or law enforcement to find a location. If you cannot return the medication, check the label or package insert to see if the medication should be thrown out in the garbage or flushed down the toilet. If you are not sure, ask your care team. If it is safe to put it in the trash, empty the medication out of the container. Mix the medication with cat litter, dirt, coffee grounds, or other unwanted substance. Seal the mixture in a bag or container. Put it in the trash. NOTE: This sheet is a summary. It may not cover all possible information. If you have questions about this medicine, talk to your doctor, pharmacist, or health care provider.  2022 Elsevier/Gold Standard (2020-12-30 00:00:00)  Standing Labs We placed an order today for your standing lab work.   Please have your standing labs drawn in 1 month , 3 months and then every 5 months  If possible, please have your labs drawn 2 weeks prior to your appointment so that the provider can discuss your results at your appointment.  Please note that you may see your imaging and lab results in [place] before we have reviewed them. We may be awaiting multiple results to interpret others before contacting you. Please allow our office up to 72 hours to thoroughly review all of the results before contacting the office for clarification of your results.  We have open lab daily: Monday through Thursday from 1:30-4:30 PM and Friday from 1:30-4:00 PM at the office  of Dr. Bo Merino, [facility].   Please be advised, all patients with office appointments requiring lab work will take precedent over walk-in lab work.  If possible, please come for your lab work on Monday and Friday afternoons, as you may experience shorter wait times. The office is located at [address], [place], [place], [place] [address] No appointment is necessary.   Labs are drawn by [facility]. Please bring your co-pay at the time of  your lab draw.  You may receive a bill from [facility] for your lab work. Vaccines You are taking a medication(s) that can suppress your immune system.  The following immunizations are recommended: Flu annually Covid-19  Td/Tdap (tetanus, diphtheria, pertussis) every 10 years Pneumonia (Prevnar 15 then Pneumovax 23 at least 1 year apart.  Alternatively, can take Prevnar 20 without needing additional dose) Shingrix: 2 doses from 4 weeks to 6 months apart  Please check with your PCP to make sure you are up to date.   Please note if you are on Hydroxychloroquine and and an order has been placed for a Hydroxychloroquine level, you will need to have it drawn 4 hours or more after your last dose.  If you wish to have your labs drawn at another location, please call the office 24 hours in advance to send orders.  If you have any questions regarding directions or hours of operation,  please call [phone].   As a reminder, please drink plenty of water prior to coming for your lab work. Thanks!   Sjogren's Syndrome Sjgren's syndrome is an inflammatory disease in which the body's disease-fighting system (immune system) attacks the glands that produce tears (lacrimal glands) and the glands that produce saliva (salivary glands). This makes the eyes and mouth very dry. Sjgren's syndrome can also affect other parts of the body, causing dryness of the skin, nose, throat, and vagina. Sjgren's syndrome is a long-term (chronic) disorder that has no cure. In some cases, it is linked to other disorders (rheumatic disorders), such as rheumatoid arthritis and systemic lupus erythematosus (SLE). It may affect other parts of the body, such as the: Blood vessels. Joints. Lungs. Kidneys. Liver or pancreas. Brain, nerves, or spinal cord. What are the causes? The cause of this condition is not known. It may be passed along from parent to child (inherited), or it may be a symptom of a rheumatic disorder. What  increases the risk? This condition is more likely to develop in: Women. People who are 71-48 years old and older. People who have recently had a viral infection or currently have a viral infection. What are the signs or symptoms? The main symptoms of this condition are: Dry mouth. This may include: A chalky feeling. Difficulty swallowing, speaking, or tasting. Frequent cavities in the teeth. Frequent mouth infections. Dry eyes. This may include: Burning, redness, and itching. Blurry vision. Fluctuating vision. Light sensitivity. Other symptoms may include: Dryness of the skin and the inside of the nose. Eyelid infections. Vaginal dryness (if applicable). Joint pain and stiffness. Muscle pain and stiffness. How is this diagnosed? This condition is diagnosed based on: Your symptoms. Your medical history. A physical exam of your eyes and mouth. Tests, including: A Schirmer test. This tests your tear production. An eye exam that is done with a magnifying device (slit-lamp exam). An eye test that temporarily stains your eye with special dyes. This shows the extent of eye damage. Tests to check your salivary gland function. Biopsy. This is a removal of part  of a salivary gland from inside your lower lip to be studied under a microscope. Chest X-rays. Blood or urine tests. How is this treated? There is no cure for this condition, but treatment can help you manage your symptoms. You may be asked to see a rheumatologist for further evaluation and treatment. This condition may be treated with: Medicines to help relieve pain and stiffness. Medicines to help relieve inflammation in your body (corticosteroids). These are usually for severe cases. Medicines to help reduce the activity of your immune system (immunosuppressants). These are usually prescribed by your health care provider or a rheumatologist. Moisture replacement therapies to help relieve dryness in your skin, mouth, and  eyes. Dry eyes may be treated with: Eye drops or nasal sprays to improve dryness of the eyes. Surgery or insertion of plugs to close the lacrimal glands (punctal occlusion). This helps keep more natural tears in your eyes. Soft contact lenses or hard scleral lenses. These are occasionally used to protect the surface of the eye. Biologic lubricating eye drops (serum tears). These are eye drops made from a person's own blood. They are used in some people with severe dry eye. Follow these instructions at home: Eye care  Use eye drops and other medicines as told by your health care provider. Protect your eyes from the sun and wind with sunglasses or glasses. Blink at least 5-6 times a minute. Maintain properly humidified air. You may want to use a humidifier at home and at work. Avoid smoke. Mouth care Brush your teeth and floss after every meal. Chew sugar-free gum or suck on hard candy. This may help to relieve dry mouth. Use antimicrobial mouthwash daily. Take frequent sips of water or sugar-free drinks. Use saliva substitutes or lip balm as told by your health care provider. See your dentist every 6 months. General instructions  Take over-the-counter and prescription medicines only as told by your health care provider. Drink enough fluid to keep your urine pale yellow. Keep all follow-up visits. This is important. Contact a health care provider if: You have a fever. You have night sweats. You are always tired. You have unexplained weight loss. You develop itchy skin. You have red patches on your skin. You have a lump or swelling on your neck. Get help right away if: You develop severe eye pain. You develop sudden decreased vision. Summary Sjgren's syndrome is a disease in which the body's immune system attacks the glands that produce tears and the glands that produce saliva. This condition makes the eyes and mouth very dry. Sjgren's syndrome is a long-term (chronic)  disorder. There is no cure for this condition, but treatment can help you manage your symptoms. The cause of this condition is not known. You may be asked to see a rheumatologist for further evaluation and treatment. This information is not intended to replace advice given to you by your health care provider. Make sure you discuss any questions you have with your health care provider. Document Revised: 03/14/2021 Document Reviewed: 03/14/2021 Elsevier Patient Education  [place].

## 2021-11-09 DIAGNOSIS — L578 Other skin changes due to chronic exposure to nonionizing radiation: Secondary | ICD-10-CM | POA: Diagnosis not present

## 2021-11-09 DIAGNOSIS — L814 Other melanin hyperpigmentation: Secondary | ICD-10-CM | POA: Diagnosis not present

## 2021-11-09 DIAGNOSIS — D224 Melanocytic nevi of scalp and neck: Secondary | ICD-10-CM | POA: Diagnosis not present

## 2021-11-21 DIAGNOSIS — M62830 Muscle spasm of back: Secondary | ICD-10-CM | POA: Diagnosis not present

## 2021-11-21 DIAGNOSIS — M722 Plantar fascial fibromatosis: Secondary | ICD-10-CM | POA: Diagnosis not present

## 2021-11-21 DIAGNOSIS — M545 Low back pain, unspecified: Secondary | ICD-10-CM | POA: Diagnosis not present

## 2021-11-21 DIAGNOSIS — M9903 Segmental and somatic dysfunction of lumbar region: Secondary | ICD-10-CM | POA: Diagnosis not present

## 2021-12-07 ENCOUNTER — Ambulatory Visit: Payer: Self-pay | Admitting: Rheumatology

## 2021-12-15 DIAGNOSIS — R2689 Other abnormalities of gait and mobility: Secondary | ICD-10-CM | POA: Diagnosis not present

## 2021-12-15 DIAGNOSIS — M25572 Pain in left ankle and joints of left foot: Secondary | ICD-10-CM | POA: Diagnosis not present

## 2021-12-19 DIAGNOSIS — M25572 Pain in left ankle and joints of left foot: Secondary | ICD-10-CM | POA: Diagnosis not present

## 2021-12-19 DIAGNOSIS — R2689 Other abnormalities of gait and mobility: Secondary | ICD-10-CM | POA: Diagnosis not present

## 2021-12-23 NOTE — Progress Notes (Deleted)
Office Visit Note  Patient: Melanie Sheppard             Date of Birth: 03-09-1966           MRN: [id number]             PCP: Wayland Denis, PA-C Referring: Wayland Denis, PA-C Visit Date: 01/05/2022 Occupation: '@GUAROCC'$ @  Subjective:  No chief complaint on file.   History of Present Illness: Melanie Sheppard is a 56 y.o. female ***   Activities of Daily Living:  Patient reports morning stiffness for *** {minute/hour:19697}.   Patient {ACTIONS;DENIES/REPORTS:21021675::"Denies"} nocturnal pain.  Difficulty dressing/grooming: {ACTIONS;DENIES/REPORTS:21021675::"Denies"} Difficulty climbing stairs: {ACTIONS;DENIES/REPORTS:21021675::"Denies"} Difficulty getting out of chair: {ACTIONS;DENIES/REPORTS:21021675::"Denies"} Difficulty using hands for taps, buttons, cutlery, and/or writing: {ACTIONS;DENIES/REPORTS:21021675::"Denies"}  No Rheumatology ROS completed.   PMFS History:  Patient Active Problem List   Diagnosis Date Noted   Scapular dyskinesis 06/09/2020   Nonallopathic lesion of sacral region 06/09/2020   Sternal pain 03/19/2020   LIPOMA 08/21/2007   EXTERNAL HEMORRHOIDS WITH OTHER COMPLICATION 58/04/9832   SHOULDER PAIN 08/21/2007   ACUTE GASTRITIS WITHOUT MENTION OF HEMORRHAGE 08/05/2007   OSTEOPENIA 08/05/2007   HYPERCHOLESTEROLEMIA 08/02/2007   ALLERGIC RHINITIS, SEASONAL 08/02/2007   REACTIVE AIRWAY DISEASE 08/02/2007   GERD 08/02/2007    No past medical history on file.  Family History  Problem Relation Age of Onset   Rheum arthritis Mother    Lung cancer Father    Healthy Brother    Healthy Brother    Healthy Son    Healthy Daughter    Breast cancer Neg Hx    Past Surgical History:  Procedure Laterality Date   CESAREAN SECTION     1998, 2002   Social History   Social History Narrative   Not on file   Immunization History  Administered Date(s) Administered   PFIZER Comirnaty(Gray Top)Covid-19 Tri-Sucrose Vaccine 10/31/2019,  11/27/2019, 08/30/2020   Td 01/06/2003     Objective: Vital Signs: There were no vitals taken for this visit.   Physical Exam   Musculoskeletal Exam: ***  CDAI Exam: CDAI Score: -- Patient Global: --; Provider Global: -- Swollen: --; Tender: -- Joint Exam 01/05/2022   No joint exam has been documented for this visit   There is currently no information documented on the homunculus. Go to the Rheumatology activity and complete the homunculus joint exam.  Investigation: No additional findings.  Imaging: No results found.  Recent Labs: Lab Results  Component Value Date   WBC 3.6 (L) 09/27/2021   HGB 14.9 09/27/2021   PLT 258 09/27/2021   NA 141 09/27/2021   K 4.3 09/27/2021   CL 106 09/27/2021   CO2 30 09/27/2021   GLUCOSE 89 09/27/2021   BUN 15 09/27/2021   CREATININE 0.76 09/27/2021   BILITOT 0.5 09/27/2021   ALKPHOS 49 06/09/2020   AST 20 09/27/2021   ALT 24 09/27/2021   PROT 7.7 09/27/2021   PROT 7.6 09/27/2021   ALBUMIN 4.3 06/09/2020   CALCIUM 10.1 09/27/2021    Speciality Comments: No specialty comments available.  Procedures:  No procedures performed Allergies: Sulfamethoxazole-trimethoprim   Assessment / Plan:     Visit Diagnoses: Sjogren's syndrome with other organ involvement (HCC)  High risk medication use  Anticardiolipin antibody positive  Chronic right shoulder pain  Plantar fasciitis  Pain in thoracic spine  Other fatigue  Fatty liver  History of gastroesophageal reflux (GERD)  History of osteopenia  Vitamin D deficiency  Family history of rheumatoid arthritis  Orders: No orders  of the defined types were placed in this encounter.  No orders of the defined types were placed in this encounter.   Face-to-face time spent with patient was *** minutes. Greater than 50% of time was spent in counseling and coordination of care.  Follow-Up Instructions: No follow-ups on file.   Ofilia Neas, PA-C  Note - This record has  been created using Dragon software.  Chart creation errors have been sought, but may not always  have been located. Such creation errors do not reflect on  the standard of medical care.

## 2022-01-05 ENCOUNTER — Ambulatory Visit: Payer: 59 | Admitting: Rheumatology

## 2022-01-05 DIAGNOSIS — Z79899 Other long term (current) drug therapy: Secondary | ICD-10-CM

## 2022-01-05 DIAGNOSIS — Z8261 Family history of arthritis: Secondary | ICD-10-CM

## 2022-01-05 DIAGNOSIS — R5383 Other fatigue: Secondary | ICD-10-CM

## 2022-01-05 DIAGNOSIS — E559 Vitamin D deficiency, unspecified: Secondary | ICD-10-CM

## 2022-01-05 DIAGNOSIS — R760 Raised antibody titer: Secondary | ICD-10-CM

## 2022-01-05 DIAGNOSIS — M722 Plantar fascial fibromatosis: Secondary | ICD-10-CM

## 2022-01-05 DIAGNOSIS — M546 Pain in thoracic spine: Secondary | ICD-10-CM

## 2022-01-05 DIAGNOSIS — M3509 Sicca syndrome with other organ involvement: Secondary | ICD-10-CM

## 2022-01-05 DIAGNOSIS — Z8739 Personal history of other diseases of the musculoskeletal system and connective tissue: Secondary | ICD-10-CM

## 2022-01-05 DIAGNOSIS — Z8719 Personal history of other diseases of the digestive system: Secondary | ICD-10-CM

## 2022-01-05 DIAGNOSIS — G8929 Other chronic pain: Secondary | ICD-10-CM

## 2022-01-05 DIAGNOSIS — K760 Fatty (change of) liver, not elsewhere classified: Secondary | ICD-10-CM

## 2024-02-04 NOTE — Telephone Encounter (Signed)
 PAS with E2C2 incorrectly chose patient's daughter as the selected patient. Patient's is a patient of Dr. Vallarie Gauze at [facility]. This chart was opened in error.     Copied from CRM [id number]. Topic: Clinical - Red Word Triage >> Feb 04, 2024  8:03 AM Antwanette L wrote: Red Word that prompted transfer to Nurse Triage: Patient is coughing up a yellow phelegm/ a little bloodThis encounter was created in error - please disregard.

## 2024-07-22 ENCOUNTER — Encounter (INDEPENDENT_AMBULATORY_CARE_PROVIDER_SITE_OTHER): Payer: Self-pay
